# Patient Record
Sex: Male | Born: 1985 | Race: Black or African American | Hispanic: No | Marital: Single | Smoking: Former smoker
Health system: Southern US, Community
[De-identification: ages and names within clinical notes are randomized; demographics above are authoritative.]

## PROBLEM LIST (undated history)

## (undated) DIAGNOSIS — F419 Anxiety disorder, unspecified: Secondary | ICD-10-CM

## (undated) DIAGNOSIS — J45909 Unspecified asthma, uncomplicated: Secondary | ICD-10-CM

## (undated) DIAGNOSIS — F329 Major depressive disorder, single episode, unspecified: Secondary | ICD-10-CM

## (undated) DIAGNOSIS — F32A Depression, unspecified: Secondary | ICD-10-CM

---
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
## 2006-07-10 ENCOUNTER — Emergency Department (HOSPITAL_COMMUNITY): Admission: EM | Admit: 2006-07-10 | Discharge: 2006-07-10 | Payer: Self-pay | Admitting: Emergency Medicine

## 2006-08-31 ENCOUNTER — Emergency Department (HOSPITAL_COMMUNITY): Admission: EM | Admit: 2006-08-31 | Discharge: 2006-08-31 | Payer: Self-pay | Admitting: Emergency Medicine

## 2007-01-30 ENCOUNTER — Emergency Department (HOSPITAL_COMMUNITY): Admission: EM | Admit: 2007-01-30 | Discharge: 2007-01-30 | Payer: Self-pay | Admitting: Emergency Medicine

## 2007-02-16 ENCOUNTER — Emergency Department (HOSPITAL_COMMUNITY): Admission: EM | Admit: 2007-02-16 | Discharge: 2007-02-16 | Payer: Self-pay | Admitting: Emergency Medicine

## 2014-03-31 ENCOUNTER — Emergency Department (HOSPITAL_COMMUNITY): Payer: Self-pay

## 2014-03-31 ENCOUNTER — Encounter (HOSPITAL_COMMUNITY): Payer: Self-pay | Admitting: Emergency Medicine

## 2014-03-31 ENCOUNTER — Emergency Department (HOSPITAL_COMMUNITY)
Admission: EM | Admit: 2014-03-31 | Discharge: 2014-03-31 | Disposition: A | Discharge status: Home / Self Care | Payer: Self-pay | Attending: Emergency Medicine | Admitting: Emergency Medicine

## 2014-03-31 DIAGNOSIS — N453 Epididymo-orchitis: Secondary | ICD-10-CM | POA: Insufficient documentation

## 2014-03-31 DIAGNOSIS — N342 Other urethritis: Secondary | ICD-10-CM | POA: Insufficient documentation

## 2014-03-31 DIAGNOSIS — N451 Epididymitis: Secondary | ICD-10-CM

## 2014-03-31 DIAGNOSIS — F17200 Nicotine dependence, unspecified, uncomplicated: Secondary | ICD-10-CM | POA: Insufficient documentation

## 2014-03-31 HISTORY — DX: Depression, unspecified: F32.A

## 2014-03-31 HISTORY — DX: Major depressive disorder, single episode, unspecified: F32.9

## 2014-03-31 HISTORY — DX: Anxiety disorder, unspecified: F41.9

## 2014-03-31 HISTORY — DX: Unspecified asthma, uncomplicated: J45.909

## 2014-03-31 LAB — URINALYSIS, ROUTINE W REFLEX MICROSCOPIC
Glucose, UA: 250 mg/dL — AB
Hgb urine dipstick: NEGATIVE
Ketones, ur: 15 mg/dL — AB
Nitrite: NEGATIVE
PH: 6 (ref 5.0–8.0)
Protein, ur: 30 mg/dL — AB
Specific Gravity, Urine: 1.025 (ref 1.005–1.030)
UROBILINOGEN UA: 1 mg/dL (ref 0.0–1.0)

## 2014-03-31 LAB — CBC WITH DIFFERENTIAL/PLATELET
Basophils Absolute: 0 10*3/uL (ref 0.0–0.1)
Basophils Relative: 0 % (ref 0–1)
Eosinophils Absolute: 0.1 10*3/uL (ref 0.0–0.7)
Eosinophils Relative: 1 % (ref 0–5)
HCT: 43.7 % (ref 39.0–52.0)
HEMOGLOBIN: 15.9 g/dL (ref 13.0–17.0)
Lymphocytes Relative: 17 % (ref 12–46)
Lymphs Abs: 1.7 10*3/uL (ref 0.7–4.0)
MCH: 32.4 pg (ref 26.0–34.0)
MCHC: 36.4 g/dL — ABNORMAL HIGH (ref 30.0–36.0)
MCV: 89 fL (ref 78.0–100.0)
MONOS PCT: 8 % (ref 3–12)
Monocytes Absolute: 0.8 10*3/uL (ref 0.1–1.0)
Neutro Abs: 7.7 10*3/uL (ref 1.7–7.7)
Neutrophils Relative %: 74 % (ref 43–77)
Platelets: 231 10*3/uL (ref 150–400)
RBC: 4.91 MIL/uL (ref 4.22–5.81)
RDW: 13.3 % (ref 11.5–15.5)
WBC: 10.4 10*3/uL (ref 4.0–10.5)

## 2014-03-31 LAB — COMPREHENSIVE METABOLIC PANEL
ALBUMIN: 4.1 g/dL (ref 3.5–5.2)
ALK PHOS: 116 U/L (ref 39–117)
ALT: 12 U/L (ref 0–53)
AST: 19 U/L (ref 0–37)
BUN: 7 mg/dL (ref 6–23)
CO2: 26 mEq/L (ref 19–32)
CREATININE: 0.91 mg/dL (ref 0.50–1.35)
Calcium: 9.4 mg/dL (ref 8.4–10.5)
Chloride: 102 mEq/L (ref 96–112)
GFR calc Af Amer: >90 mL/min (ref >90)
GFR calc non Af Amer: >90 mL/min (ref >90)
Glucose, Bld: 72 mg/dL (ref 70–99)
POTASSIUM: 3.8 meq/L (ref 3.7–5.3)
Sodium: 141 mEq/L (ref 137–147)
TOTAL PROTEIN: 7.3 g/dL (ref 6.0–8.3)
Total Bilirubin: 0.4 mg/dL (ref 0.3–1.2)

## 2014-03-31 LAB — URINE MICROSCOPIC-ADD ON

## 2014-03-31 MED ORDER — HYDROCODONE-ACETAMINOPHEN 5-325 MG PO TABS: 1.0000 | ORAL_TABLET | Freq: Four times a day (QID) | ORAL | Status: DC | PRN

## 2014-03-31 MED ORDER — IBUPROFEN 800 MG PO TABS: 800.0000 mg | ORAL_TABLET | Freq: Three times a day (TID) | ORAL | Status: DC

## 2014-03-31 MED ORDER — OXYCODONE-ACETAMINOPHEN 5-325 MG PO TABS
1.0000 | ORAL_TABLET | Freq: Once | ORAL | Status: AC
  Administered 2014-03-31: 1 via ORAL
  Filled 2014-03-31: qty 1

## 2014-03-31 MED ORDER — CEFTRIAXONE SODIUM 250 MG IJ SOLR
250.0000 mg | Freq: Once | INTRAMUSCULAR | Status: AC
  Administered 2014-03-31: 250 mg via INTRAMUSCULAR
  Filled 2014-03-31: qty 250

## 2014-03-31 MED ORDER — DOXYCYCLINE HYCLATE 100 MG PO CAPS: 100.0000 mg | ORAL_CAPSULE | Freq: Two times a day (BID) | ORAL | Status: DC

## 2014-03-31 MED ORDER — DOXYCYCLINE HYCLATE 100 MG PO TABS
100.0000 mg | ORAL_TABLET | Freq: Once | ORAL | Status: AC
  Administered 2014-03-31: 100 mg via ORAL
  Filled 2014-03-31: qty 1

## 2014-03-31 NOTE — ED Provider Notes (Signed)
CSN: [id number]     Arrival date & time 03/31/14  1605 History   First MD Initiated Contact with Patient 03/31/14 1702     Chief Complaint  Patient presents with  . Testicle Pain     (Consider location/radiation/quality/duration/timing/severity/associated sxs/prior Treatment) HPI Issam A Edmonston is a 28 y.o. male who presents emergency department complaining of left testicle pain and swelling. States symptoms began yesterday. States he is having pain with voiding. Also admits to penile discharge. Patient 6 reactive, states he does use protection. He denies any prior similar episodes. He denies any abdominal pain or flank pain. He states he felt feverish at home, but is afebrile here. He's not taking any medications for his symptoms. States onset of pain is gradual, started last night, and is worsening today. Pain is worsened with palpation of his testes and urination.  History reviewed. No pertinent past medical history. History reviewed. No pertinent past surgical history. No family history on file. History  Substance Use Topics  . Smoking status: Current Every Day Smoker  . Smokeless tobacco: Not on file  . Alcohol Use: Yes    Review of Systems  Constitutional: Positive for chills. Negative for fever.  Respiratory: Negative for cough, chest tightness and shortness of breath.   Cardiovascular: Negative for chest pain, palpitations and leg swelling.  Gastrointestinal: Negative for nausea, vomiting, abdominal pain, diarrhea and abdominal distention.  Genitourinary: Positive for dysuria, urgency, discharge, scrotal swelling and testicular pain. Negative for frequency, hematuria and penile pain.  Musculoskeletal: Negative for arthralgias, myalgias, neck pain and neck stiffness.  Skin: Negative for rash.  Allergic/Immunologic: Negative for immunocompromised state.  Neurological: Negative for dizziness, weakness, light-headedness, numbness and headaches.      Allergies  Review of  patient's allergies indicates no known allergies.  Home Medications  No current outpatient prescriptions on file. BP 129/84  Pulse 83  Temp(Src) 97.9 F (36.6 C) (Oral)  Resp 14  Ht 5\' 10"  (1.778 m)  Wt 135 lb (61.236 kg)  BMI 19.37 kg/m2  SpO2 100% Physical Exam  Nursing note and vitals reviewed. Constitutional: He is oriented to person, place, and time. He appears well-developed and well-nourished. No distress.  HENT:  Head: Normocephalic and atraumatic.  Eyes: Conjunctivae are normal.  Neck: Neck supple.  Cardiovascular: Normal rate, regular rhythm and normal heart sounds.   Pulmonary/Chest: Effort normal. No respiratory distress. He has no wheezes. He has no rales.  Abdominal: Soft. Bowel sounds are normal. He exhibits no distension. There is no tenderness. There is no rebound.  Genitourinary:  Clear discharge from the penis. No lesions of the perineum. Left testicle is enlarged, tender over the epididymis, cremasteric reflexes present.  Musculoskeletal: He exhibits no edema.  Neurological: He is alert and oriented to person, place, and time.  Skin: Skin is warm and dry.    ED Course  Procedures (including critical care time) Labs Review Labs Reviewed  URINALYSIS, ROUTINE W REFLEX MICROSCOPIC - Abnormal; Notable for the following:    APPearance HAZY (*)    Glucose, UA 250 (*)    Bilirubin Urine SMALL (*)    Ketones, ur 15 (*)    Protein, ur 30 (*)    Leukocytes, UA LARGE (*)    All other components within normal limits  CBC WITH DIFFERENTIAL - Abnormal; Notable for the following:    MCHC 36.4 (*)    All other components within normal limits  URINE MICROSCOPIC-ADD ON - Abnormal; Notable for the following:    Bacteria,  UA MANY (*)    All other components within normal limits  GC/CHLAMYDIA PROBE AMP  URINE CULTURE  COMPREHENSIVE METABOLIC PANEL   Imaging Review [place] Scrotum  03/31/2014   CLINICAL DATA:  Testicular pain .  EXAM: SCROTAL ULTRASOUND  DOPPLER  ULTRASOUND OF THE TESTICLES  TECHNIQUE: Complete ultrasound examination of the testicles, epididymis, and other scrotal structures was performed. Color and spectral Doppler ultrasound were also utilized to evaluate blood flow to the testicles.  COMPARISON:  None.  FINDINGS: Right testicle  Measurements: 4.9 x 2.1 x 3.4 cm. No mass or microlithiasis visualized.  Left testicle  Measurements: 4.4 x 2.3 x 3.2 cm. No mass or microlithiasis visualized.  Right epididymis: 5.9 mm epididymal cyst. Epididymis otherwise unremarkable.  Left epididymis: Mild prominence is noted of the left epididymis with increased blood flow. Epididymitis cannot be excluded.  Hydrocele:  None visualized.  Varicocele:  None visualized.  Pulsed Doppler interrogation of both testes demonstrates low resistance arterial and venous waveforms bilaterally.  IMPRESSION: 1. No evidence of testicular torsion. 2. Cannot exclude mild changes of left epididymitis.   Electronically Signed   By: Maisie Fushomas  Register   On: 03/31/2014 18:53     EKG Interpretation None      MDM   Final diagnoses:  Epididymitis, left  Urethritis   Patient with infected urine, left testicular pain, and tenderness mainly over epididymis. Suspect epididymitis. Will get ultrasound to confirm and to rule out torsion.   7:24 PM US consistent with mild left epididymitis. Given his infected urine and penile discharge will cover for STI. Given rocephin 250mg  IM, will start on doxycycline for 10 days. Fu with urology. Ibuprofen and 10 norcos given for pain  Filed Vitals:   03/31/14 1616 03/31/14 1714  BP: 120/106 129/84  Pulse: 110 83  Temp: 97.8 F (36.6 C) 97.9 F (36.6 C)  TempSrc:  Oral  Resp: 18 14  Height: 5\' 10"  (1.778 m)   Weight: 135 lb (61.236 kg)   SpO2: 98% 100%     Lottie Musselatyana A Jodel Mayhall, PA-C 03/31/14 1930  Lottie Musselatyana A Jayd Forrey, PA-C 03/31/14 1930

## 2014-03-31 NOTE — Discharge Instructions (Signed)
See information below regarding epididymitis. Take ibuprofen for pain. Take doxycycline as prescribed until all gone for the infection. Take Norco for severe pain only. If not improving please followup with urology. No intercourse for a week. Make sure your partner is treated as well for a sexually transmitted infection.   Epididymitis Epididymitis is a swelling (inflammation) of the epididymis. The epididymis is a cord-like structure along the back part of the testicle. Epididymitis is usually, but not always, caused by infection. This is usually a sudden problem beginning with chills, fever and pain behind the scrotum and in the testicle. There may be swelling and redness of the testicle. DIAGNOSIS  Physical examination will reveal a tender, swollen epididymis. Sometimes, cultures are obtained from the urine or from prostate secretions to help find out if there is an infection or if the cause is a different problem. Sometimes, blood work is performed to see if your white blood cell count is elevated and if a germ (bacterial) or viral infection is present. Using this knowledge, an appropriate medicine which kills germs (antibiotic) can be chosen by your caregiver. A viral infection causing epididymitis will most often go away (resolve) without treatment. HOME CARE INSTRUCTIONS   Hot sitz baths for 20 minutes, 4 times per day, may help relieve pain.  Only take over-the-counter or prescription medicines for pain, discomfort or fever as directed by your caregiver.  Take all medicines, including antibiotics, as directed. Take the antibiotics for the full prescribed length of time even if you are feeling better.  It is very important to keep all follow-up appointments. SEEK IMMEDIATE MEDICAL CARE IF:   You have a fever.  You have pain not relieved with medicines.  You have any worsening of your problems.  Your pain seems to come and go.  You develop pain, redness, and swelling in the scrotum  and surrounding areas. MAKE SURE YOU:   Understand these instructions.  Will watch your condition.  Will get help right away if you are not doing well or get worse. Document Released: 12/12/2000 Document Revised: 03/08/2012 Document Reviewed: 11/01/2009 [facility] Patient Information 2014 [employer] [place].

## 2014-03-31 NOTE — ED Notes (Signed)
Introduced self and discussed that results of Ultrasound are not available yet.

## 2014-03-31 NOTE — ED Notes (Signed)
Lemont Fillersatyana, PA-C at the bedside to discuss results of the ultrasound, and the need to continue treatment with medications at home after discharge.   Patient acknowledges.

## 2014-03-31 NOTE — ED Notes (Signed)
The pt is c/o his testicle swelling since yesterday.  He is also having difficulty voiding

## 2014-04-01 LAB — GC/CHLAMYDIA PROBE AMP
CT PROBE, AMP APTIMA: POSITIVE — AB
GC Probe RNA: POSITIVE — AB

## 2014-04-01 NOTE — ED Provider Notes (Signed)
Medical screening examination/treatment/procedure(s) were performed by non-physician practitioner and as supervising physician I was immediately available for consultation/collaboration.   EKG Interpretation None        Junius ArgyleForrest S Skyleigh Windle, MD 04/01/14 1236

## 2014-04-02 ENCOUNTER — Telehealth (HOSPITAL_BASED_OUTPATIENT_CLINIC_OR_DEPARTMENT_OTHER): Payer: Self-pay | Admitting: Emergency Medicine

## 2014-04-02 LAB — URINE CULTURE

## 2014-04-02 NOTE — Telephone Encounter (Signed)
+  Chlamydia. +Gonorrhea. Patient treated with Rocephin and Zithromax. [employer] faxed. 

## 2014-06-10 ENCOUNTER — Other Ambulatory Visit (HOSPITAL_COMMUNITY)
Admission: RE | Admit: 2014-06-10 | Discharge: 2014-06-10 | Disposition: A | Discharge status: Home / Self Care | Payer: Self-pay | Source: Ambulatory Visit | Attending: Emergency Medicine | Admitting: Emergency Medicine

## 2014-06-10 ENCOUNTER — Encounter (HOSPITAL_COMMUNITY): Payer: Self-pay | Admitting: Emergency Medicine

## 2014-06-10 ENCOUNTER — Emergency Department (INDEPENDENT_AMBULATORY_CARE_PROVIDER_SITE_OTHER)
Admission: EM | Admit: 2014-06-10 | Discharge: 2014-06-10 | Disposition: A | Discharge status: Home / Self Care | Payer: Self-pay | Source: Home / Self Care | Attending: Emergency Medicine | Admitting: Emergency Medicine

## 2014-06-10 DIAGNOSIS — N342 Other urethritis: Secondary | ICD-10-CM

## 2014-06-10 DIAGNOSIS — N451 Epididymitis: Secondary | ICD-10-CM

## 2014-06-10 DIAGNOSIS — Z113 Encounter for screening for infections with a predominantly sexual mode of transmission: Secondary | ICD-10-CM | POA: Insufficient documentation

## 2014-06-10 LAB — POCT URINALYSIS DIP (DEVICE)
Bilirubin Urine: NEGATIVE
GLUCOSE, UA: NEGATIVE mg/dL
Hgb urine dipstick: NEGATIVE
KETONES UR: NEGATIVE mg/dL
Leukocytes, UA: NEGATIVE
Nitrite: NEGATIVE
Protein, ur: NEGATIVE mg/dL
SPECIFIC GRAVITY, URINE: 1.02 (ref 1.005–1.030)
Urobilinogen, UA: 0.2 mg/dL (ref 0.0–1.0)
pH: 6 (ref 5.0–8.0)

## 2014-06-10 LAB — HIV ANTIBODY (ROUTINE TESTING W REFLEX): HIV: NONREACTIVE

## 2014-06-10 LAB — RPR

## 2014-06-10 MED ORDER — AZITHROMYCIN 250 MG PO TABS
ORAL_TABLET | ORAL | Status: AC
  Filled 2014-06-10: qty 4

## 2014-06-10 MED ORDER — CEFTRIAXONE SODIUM 250 MG IJ SOLR
250.0000 mg | Freq: Once | INTRAMUSCULAR | Status: AC
  Administered 2014-06-10: 250 mg via INTRAMUSCULAR

## 2014-06-10 MED ORDER — CEFTRIAXONE SODIUM 250 MG IJ SOLR
INTRAMUSCULAR | Status: AC
  Filled 2014-06-10: qty 250

## 2014-06-10 MED ORDER — AZITHROMYCIN 250 MG PO TABS
1000.0000 mg | ORAL_TABLET | Freq: Once | ORAL | Status: AC
  Administered 2014-06-10: 1000 mg via ORAL

## 2014-06-10 NOTE — ED Notes (Signed)
C/o std check up States he does have a discharge and blood coming from penile area

## 2014-06-10 NOTE — ED Provider Notes (Signed)
Chief Complaint   Chief Complaint  Patient presents with  . SEXUALLY TRANSMITTED DISEASE    History of Present Illness   Ryan [name] is a 28 year old male who has had burning with urination, urethral discharge which is clear to yellow to green to bloody. He has some crusted blisters on his penis, and enlarged, tender lymph node in his right groin, and bilateral testicular pain and tenderness with a lump on the left side. He was seen at the emergency room for similar symptoms about 2 months ago. He tested positive for Chlamydia and gonorrhea and was treated with Rocephin and azithromycin. He's been sexually active with a single girlfriend since that time. She's being tested today and. He also was found to have epididymitis. An ultrasound of the scrotum was done which showed no scrotal tumors or masses and no evidence of torsion. He denies fever, chills, eye redness or discharge, sore throat, cervical adenopathy, skin rash, or joint pain.  Review of Systems   Other than as noted above, the patient denies any of the following symptoms: Systemic:  No fevers chills, arthralgias, or adenopathy. GI:  No abdominal pain, nausea or vomiting. GU:  No dysuria, penile pain, discharge, itching, dysuria, genital lesions, testicular pain or swelling. Skin:  No rash or itching.  PMFSH   Past medical history, family history, social history, meds, and allergies were reviewed.   Physical Examination    Vital signs:  BP 112/64  Pulse 81  Temp(Src) 97.9 F (36.6 C) (Oral)  Resp 16  SpO2 100% Gen:  Alert, oriented, in no distress. Abdomen:  Soft and flat, non-distended, and non-tender.  No organomegaly or mass. Genital:  There was no urethral discharge but there was some redness and inflammation around the tip of the urethra. He has some crusted ulcerations on the dorsum of the penis. There is a 1.5 cm tender lymph node in the right groin and the testes are normal. There is a 1 cm lump just below  left testis which was nontender. Skin:  Warm and dry.  No rash.   Labs   Results for orders placed during the hospital encounter of 06/10/14  HIV ANTIBODY (ROUTINE TESTING)      Result Value Ref Range   HIV 1&2 Ab, 4th Generation NONREACTIVE  NONREACTIVE  RPR      Result Value Ref Range   RPR NON REAC  NON REAC  POCT URINALYSIS DIP (DEVICE)      Result Value Ref Range   Glucose, UA NEGATIVE  NEGATIVE mg/dL   Bilirubin Urine NEGATIVE  NEGATIVE   Ketones, ur NEGATIVE  NEGATIVE mg/dL   Specific Gravity, Urine 1.020  1.005 - 1.030   Hgb urine dipstick NEGATIVE  NEGATIVE   pH 6.0  5.0 - 8.0   Protein, ur NEGATIVE  NEGATIVE mg/dL   Urobilinogen, UA 0.2  0.0 - 1.0 mg/dL   Nitrite NEGATIVE  NEGATIVE   Leukocytes, UA NEGATIVE  NEGATIVE     DNA probes for gonorrhea, Chlamydia, Trichomonas were also obtained.  Course in Urgent Care Center   Given Rocephin 250 mg IM and azithromycin 1000 mg by mouth.   Assessment   The primary encounter diagnosis was Urethritis. A diagnosis of Epididymitis was also pertinent to this visit.  Plan    1.  Meds:  The following meds were prescribed:   Discharge Medication List as of 06/10/2014 12:11 PM      2.  Patient Education/Counseling:  The patient was given appropriate handouts,  self care instructions, and instructed in symptomatic relief.The patient was instructed to inform all sexual contacts, avoid intercourse completely for 2 weeks and then only with a condom.  The patient was told that we would call about all abnormal lab results, and that we would need to report certain kinds of infection to the health department.    3.  Follow up:  The patient was told to follow up here if no better in 3 to 4 days, or sooner if becoming worse in any way, and given some red flag symptoms such as fever, pain, or difficulty urinating which would prompt immediate return.  Follow up with Dr. Vernie Ammonsttelin with regard to the epididymitis. He's had ultrasound of the mass  in his left scrotum, so very unlikely that this is due to a tumor.     Reuben Likesavid C Savian Mazon, MD 06/10/14 2136

## 2014-06-10 NOTE — Discharge Instructions (Signed)
Follow up with Dr. Vernie Ammonsttelin, the lump in the testicle could be a tumor.    No intercourse for next week and then only with a condom.

## 2014-06-10 NOTE — ED Notes (Signed)
Call back number verified.  

## 2014-06-12 LAB — HSV 2 ANTIBODY, IGG

## 2014-06-13 NOTE — ED Notes (Signed)
GC/Chlamydia pos., HIV/RPR non-reactive, HSV 2 <0.10 neg.  Pt. adequately treated with Rocephin and Zithromax.  Pt. needs notified. Vassie MoselleYork, Suzanne M 06/13/2014

## 2014-06-14 ENCOUNTER — Telehealth (HOSPITAL_COMMUNITY): Payer: Self-pay | Admitting: *Deleted

## 2014-06-14 NOTE — ED Notes (Signed)
I called and left a message to call.  Call 1.  [facility] x 2 [facility]. Vassie MoselleYork, Suzanne M 06/14/2014

## 2014-06-15 NOTE — ED Notes (Signed)
Pt. called back.  Pt. verified x 2 and given results. Pt. told he was adequately treated while he was here.  Pt. instructed to notify his partner, no sex for 1 week after the medication and to practice safe sex. Pt. told he [facility], by appointment.  Pt.'s questions about Herpes and Trich answered.  Pt. voiced understanding. Vassie MoselleYork, Suzanne M 06/15/2014

## 2016-05-14 ENCOUNTER — Encounter (HOSPITAL_COMMUNITY): Payer: Self-pay

## 2016-05-14 ENCOUNTER — Emergency Department (HOSPITAL_COMMUNITY)
Admission: EM | Admit: 2016-05-14 | Discharge: 2016-05-15 | Disposition: A | Discharge status: Home / Self Care | Payer: Self-pay | Attending: Emergency Medicine | Admitting: Emergency Medicine

## 2016-05-14 DIAGNOSIS — R109 Unspecified abdominal pain: Secondary | ICD-10-CM

## 2016-05-14 DIAGNOSIS — F17210 Nicotine dependence, cigarettes, uncomplicated: Secondary | ICD-10-CM | POA: Insufficient documentation

## 2016-05-14 DIAGNOSIS — Z202 Contact with and (suspected) exposure to infections with a predominantly sexual mode of transmission: Secondary | ICD-10-CM | POA: Insufficient documentation

## 2016-05-14 DIAGNOSIS — N5089 Other specified disorders of the male genital organs: Secondary | ICD-10-CM

## 2016-05-14 DIAGNOSIS — N509 Disorder of male genital organs, unspecified: Secondary | ICD-10-CM | POA: Insufficient documentation

## 2016-05-14 DIAGNOSIS — J45909 Unspecified asthma, uncomplicated: Secondary | ICD-10-CM | POA: Insufficient documentation

## 2016-05-14 DIAGNOSIS — Z791 Long term (current) use of non-steroidal anti-inflammatories (NSAID): Secondary | ICD-10-CM | POA: Insufficient documentation

## 2016-05-14 DIAGNOSIS — Z792 Long term (current) use of antibiotics: Secondary | ICD-10-CM | POA: Insufficient documentation

## 2016-05-14 DIAGNOSIS — Z8659 Personal history of other mental and behavioral disorders: Secondary | ICD-10-CM | POA: Insufficient documentation

## 2016-05-14 LAB — COMPREHENSIVE METABOLIC PANEL
ALT: 20 U/L (ref 17–63)
AST: 21 U/L (ref 15–41)
Albumin: 4.4 g/dL (ref 3.5–5.0)
Alkaline Phosphatase: 69 U/L (ref 38–126)
Anion gap: 10 (ref 5–15)
BUN: 6 mg/dL (ref 6–20)
CO2: 28 mmol/L (ref 22–32)
Calcium: 9.7 mg/dL (ref 8.9–10.3)
Chloride: 104 mmol/L (ref 101–111)
Creatinine, Ser: 1.21 mg/dL (ref 0.61–1.24)
GFR calc Af Amer: >60 mL/min (ref >60)
GFR calc non Af Amer: >60 mL/min (ref >60)
Glucose, Bld: 87 mg/dL (ref 65–99)
Potassium: 3.8 mmol/L (ref 3.5–5.1)
Sodium: 142 mmol/L (ref 135–145)
Total Bilirubin: 0.9 mg/dL (ref 0.3–1.2)
Total Protein: 7.2 g/dL (ref 6.5–8.1)

## 2016-05-14 LAB — URINE MICROSCOPIC-ADD ON: RBC / HPF: NONE SEEN RBC/hpf (ref 0–5)

## 2016-05-14 LAB — URINALYSIS, ROUTINE W REFLEX MICROSCOPIC
Bilirubin Urine: NEGATIVE
Glucose, UA: NEGATIVE mg/dL
Hgb urine dipstick: NEGATIVE
Ketones, ur: NEGATIVE mg/dL
Nitrite: NEGATIVE
Protein, ur: NEGATIVE mg/dL
Specific Gravity, Urine: 1.016 (ref 1.005–1.030)
pH: 6 (ref 5.0–8.0)

## 2016-05-14 LAB — CBC
HCT: 41.9 % (ref 39.0–52.0)
Hemoglobin: 14.8 g/dL (ref 13.0–17.0)
MCH: 31.8 pg (ref 26.0–34.0)
MCHC: 35.3 g/dL (ref 30.0–36.0)
MCV: 90.1 fL (ref 78.0–100.0)
Platelets: 328 10*3/uL (ref 150–400)
RBC: 4.65 MIL/uL (ref 4.22–5.81)
RDW: 12.9 % (ref 11.5–15.5)
WBC: 7.9 10*3/uL (ref 4.0–10.5)

## 2016-05-14 LAB — LIPASE, BLOOD: Lipase: 22 U/L (ref 11–51)

## 2016-05-14 MED ORDER — METRONIDAZOLE 500 MG PO TABS
2000.0000 mg | ORAL_TABLET | Freq: Once | ORAL | Status: AC
  Administered 2016-05-15: 2000 mg via ORAL
  Filled 2016-05-14: qty 4

## 2016-05-14 MED ORDER — ONDANSETRON 4 MG PO TBDP
4.0000 mg | ORAL_TABLET | Freq: Once | ORAL | Status: AC
  Administered 2016-05-15: 4 mg via ORAL
  Filled 2016-05-14: qty 1

## 2016-05-14 MED ORDER — STERILE WATER FOR INJECTION IJ SOLN
INTRAMUSCULAR | Status: AC
  Administered 2016-05-15: 2 mL
  Filled 2016-05-14: qty 10

## 2016-05-14 MED ORDER — AZITHROMYCIN 250 MG PO TABS
1000.0000 mg | ORAL_TABLET | Freq: Once | ORAL | Status: AC
Start: 2016-05-15 — End: 2016-05-15
  Administered 2016-05-15: 1000 mg via ORAL
  Filled 2016-05-14: qty 4

## 2016-05-14 MED ORDER — CEFTRIAXONE SODIUM 250 MG IJ SOLR
250.0000 mg | Freq: Once | INTRAMUSCULAR | Status: AC
  Administered 2016-05-15: 250 mg via INTRAMUSCULAR
  Filled 2016-05-14: qty 250

## 2016-05-14 NOTE — ED Notes (Signed)
Pt reports LLQ abdominal pain and occasional dysuria, unsure when it started. He denies any other symptoms.

## 2016-05-14 NOTE — ED Provider Notes (Signed)
CSN: [id number]     Arrival date & time 05/14/16  2035 History   First MD Initiated Contact with Patient 05/14/16 2256     Chief Complaint  Patient presents with  . Abdominal Pain     (Consider location/radiation/quality/duration/timing/severity/associated sxs/prior Treatment) HPI  This is a 30 year old male who presents with multiple complaints. Patient states that he had some intermittent pain in the left side of his abdomen. He states it comes and goes. He has had associated dysuria over the past 2 weeks. He denies any constipation, diarrhea. Has no active pain, nausea, vomiting, fevers, chills, myalgias. Patient is also concerned because he has some "swelling in his neck which comes and goes." He is also concerned because of a knot on his left testicle. He states that his girlfriend was here 2 weeks ago, diagnosed with PID. He has not been tested or treated. He denies any discharge from his penis. Past Medical History  Diagnosis Date  . Asthma   . Anxiety   . Depression    History reviewed. No pertinent past surgical history. No family history on file. Social History  Substance Use Topics  . Smoking status: Current Every Day Smoker -- 1.00 packs/day for 5 years    Types: Cigarettes  . Smokeless tobacco: None  . Alcohol Use: 1.2 oz/week    2 Cans of beer per week     Comment: had a beer last night.     Review of Systems   Ten systems reviewed and are negative for acute change, except as noted in the HPI.   Allergies  Strawberry extract and Pollen extract  Home Medications   Prior to Admission medications   Medication Sig Start Date End Date Taking? Authorizing Provider  ibuprofen (ADVIL,MOTRIN) 200 MG tablet Take 200 mg by mouth every 6 (six) hours as needed for headache or moderate pain.   Yes Historical Provider, MD  doxycycline (VIBRAMYCIN) 100 MG capsule Take 1 capsule (100 mg total) by mouth 2 (two) times daily. 03/31/14   Tatyana Kirichenko, PA-C   HYDROcodone-acetaminophen (NORCO) 5-325 MG per tablet Take 1 tablet by mouth every 6 (six) hours as needed for moderate pain. 03/31/14   Tatyana Kirichenko, PA-C  ibuprofen (ADVIL,MOTRIN) 800 MG tablet Take 1 tablet (800 mg total) by mouth 3 (three) times daily. 03/31/14   Tatyana Kirichenko, PA-C   BP 108/88 mmHg  Pulse 70  Temp(Src) 98.1 F (36.7 C) (Oral)  Resp 16  SpO2 98% Physical Exam  Constitutional: He appears well-developed and well-nourished. No distress.  HENT:  Head: Normocephalic and atraumatic.  Eyes: Conjunctivae are normal. No scleral icterus.  Neck: Normal range of motion. Neck supple.  Cardiovascular: Normal rate, regular rhythm and normal heart sounds.   Pulmonary/Chest: Effort normal and breath sounds normal. No respiratory distress.  Abdominal: Soft. He exhibits no distension and no mass. There is no tenderness. There is no guarding.  Genitourinary:  Normal male anatomy. Circumcised. No discharge or lesions. There is a firm, irregular mass attached firmly to the left testicle. No pain with palpation, normal cremasteric reflex.  Musculoskeletal: He exhibits no edema.  Neurological: He is alert.  Skin: Skin is warm and dry. He is not diaphoretic.  Psychiatric: His behavior is normal.  Nursing note and vitals reviewed.   ED Course  Procedures (including critical care time) Labs Review Labs Reviewed  URINALYSIS, ROUTINE W REFLEX MICROSCOPIC (NOT AT [facility]) - Abnormal; Notable for the following:    Leukocytes, UA SMALL (*)  All other components within normal limits  URINE MICROSCOPIC-ADD ON - Abnormal; Notable for the following:    Squamous Epithelial / LPF 0-5 (*)    Bacteria, UA RARE (*)    All other components within normal limits  LIPASE, BLOOD  COMPREHENSIVE METABOLIC PANEL  CBC    Imaging Review No results found. I have personally reviewed and evaluated these images and lab results as part of my medical decision-making.   EKG Interpretation None       MDM   Final diagnoses:  Exposure to STD  Testicular mass  Abdominal discomfort    Patient has to leave to pick up his son. We will treat prophylactically for infection. Patient is advised to follow-up for evaluation of the testicle as well as testing for HIV and syphilis. He has no abdominal pain at this time. Benign abdominal exam. Appears Safe for discharge. Discussed return precautions    Arthor Captainbigail Shaasia Odle, PA-C 05/15/16 0151  Raeford RazorStephen Kohut, MD 05/22/16 [phone]

## 2016-05-15 NOTE — Discharge Instructions (Signed)
Please return for a testicular UltraSound    Abdominal Pain, Adult Many things can cause abdominal pain. Usually, abdominal pain is not caused by a disease and will improve without treatment. It can often be observed and treated at home. Your health care provider will do a physical exam and possibly order blood tests and X-rays to help determine the seriousness of your pain. However, in many cases, more time must pass before a clear cause of the pain can be found. Before that point, your health care provider may not know if you need more testing or further treatment. HOME CARE INSTRUCTIONS Monitor your abdominal pain for any changes. The following actions may help to alleviate any discomfort you are experiencing:  Only take over-the-counter or prescription medicines as directed by your health care provider.  Do not take laxatives unless directed to do so by your health care provider.  Try a clear liquid diet (broth, tea, or water) as directed by your health care provider. Slowly move to a bland diet as tolerated. SEEK MEDICAL CARE IF:  You have unexplained abdominal pain.  You have abdominal pain associated with nausea or diarrhea.  You have pain when you urinate or have a bowel movement.  You experience abdominal pain that wakes you in the night.  You have abdominal pain that is worsened or improved by eating food.  You have abdominal pain that is worsened with eating fatty foods.  You have a fever. SEEK IMMEDIATE MEDICAL CARE IF:  Your pain does not go away within 2 hours.  You keep throwing up (vomiting).  Your pain is felt only in portions of the abdomen, such as the right side or the left lower portion of the abdomen.  You pass bloody or black tarry stools. MAKE SURE YOU:  Understand these instructions.  Will watch your condition.  Will get help right away if you are not doing well or get worse.   This information is not intended to replace advice given to you by  your health care provider. Make sure you discuss any questions you have with your health care provider.   Document Released: 09/24/2005 Document Revised: 09/05/2015 Document Reviewed: 08/24/2013 Elsevier Interactive Patient Education [employer]

## 2016-10-24 ENCOUNTER — Emergency Department (HOSPITAL_COMMUNITY)
Admission: EM | Admit: 2016-10-24 | Discharge: 2016-10-26 | Disposition: A | Discharge status: Home / Self Care | Payer: Self-pay | Attending: Dermatology | Admitting: Dermatology

## 2016-10-24 ENCOUNTER — Encounter (HOSPITAL_COMMUNITY): Payer: Self-pay | Admitting: Nurse Practitioner

## 2016-10-24 DIAGNOSIS — J45909 Unspecified asthma, uncomplicated: Secondary | ICD-10-CM | POA: Insufficient documentation

## 2016-10-24 DIAGNOSIS — H9201 Otalgia, right ear: Secondary | ICD-10-CM | POA: Insufficient documentation

## 2016-10-24 DIAGNOSIS — F17210 Nicotine dependence, cigarettes, uncomplicated: Secondary | ICD-10-CM | POA: Insufficient documentation

## 2016-10-24 NOTE — ED Notes (Addendum)
Pt c/o swelling and tenderness to R ear and neck, swollen area noted.  Pt also c/o intermittent dizziness that clears quickly for unkn amount of time.  Denies fever, denies n/v/d, denies pain at this time, only tenderness on palpation  Pt can be hear arguing with male on the telephone.

## 2016-10-24 NOTE — ED Triage Notes (Signed)
Pt presents with chief complaint of R ear pain. He reports onset of pain several days ago. The pain has been constant since onset. He has noticed a swolen area behind his R ear that he feels is expanding into his throat and making him feel like he can not breathe normally. He reports intermittent dizziness. He denies fevers, rhinorrhea, cough. He is alert and breathing easily

## 2016-10-24 NOTE — ED Notes (Signed)
Pt d/c himself from monitors and walked from department. Pt talking on telephone while walking out. Pt apologized after when ask if he was leaving and kept walking to waiting area

## 2016-11-05 ENCOUNTER — Encounter (HOSPITAL_COMMUNITY): Payer: Self-pay | Admitting: Emergency Medicine

## 2016-11-05 ENCOUNTER — Emergency Department (HOSPITAL_COMMUNITY)
Admission: EM | Admit: 2016-11-05 | Discharge: 2016-11-05 | Disposition: A | Discharge status: Home / Self Care | Payer: Self-pay | Attending: Emergency Medicine | Admitting: Emergency Medicine

## 2016-11-05 ENCOUNTER — Emergency Department (HOSPITAL_COMMUNITY): Payer: Self-pay

## 2016-11-05 DIAGNOSIS — R0789 Other chest pain: Secondary | ICD-10-CM | POA: Insufficient documentation

## 2016-11-05 DIAGNOSIS — J45909 Unspecified asthma, uncomplicated: Secondary | ICD-10-CM | POA: Insufficient documentation

## 2016-11-05 DIAGNOSIS — R079 Chest pain, unspecified: Secondary | ICD-10-CM

## 2016-11-05 DIAGNOSIS — F17210 Nicotine dependence, cigarettes, uncomplicated: Secondary | ICD-10-CM | POA: Insufficient documentation

## 2016-11-05 DIAGNOSIS — R42 Dizziness and giddiness: Secondary | ICD-10-CM | POA: Insufficient documentation

## 2016-11-05 LAB — BASIC METABOLIC PANEL
Anion gap: 8 (ref 5–15)
BUN: 8 mg/dL (ref 6–20)
CALCIUM: 9.4 mg/dL (ref 8.9–10.3)
CHLORIDE: 107 mmol/L (ref 101–111)
CO2: 26 mmol/L (ref 22–32)
CREATININE: 1.08 mg/dL (ref 0.61–1.24)
GFR calc Af Amer: >60 mL/min (ref >60)
GFR calc non Af Amer: >60 mL/min (ref >60)
GLUCOSE: 98 mg/dL (ref 65–99)
Potassium: 3.9 mmol/L (ref 3.5–5.1)
Sodium: 141 mmol/L (ref 135–145)

## 2016-11-05 LAB — CBC
HEMATOCRIT: 41.3 % (ref 39.0–52.0)
HEMOGLOBIN: 14.5 g/dL (ref 13.0–17.0)
MCH: 31.3 pg (ref 26.0–34.0)
MCHC: 35.1 g/dL (ref 30.0–36.0)
MCV: 89 fL (ref 78.0–100.0)
Platelets: 220 10*3/uL (ref 150–400)
RBC: 4.64 MIL/uL (ref 4.22–5.81)
RDW: 13.3 % (ref 11.5–15.5)
WBC: 6.6 10*3/uL (ref 4.0–10.5)

## 2016-11-05 LAB — I-STAT TROPONIN, ED: Troponin i, poc: 0 ng/mL (ref 0.00–0.08)

## 2016-11-05 MED ORDER — FAMOTIDINE 20 MG PO TABS: 20.0000 mg | ORAL_TABLET | Freq: Two times a day (BID) | ORAL | 0 refills | Status: DC

## 2016-11-05 MED ORDER — NAPROXEN 500 MG PO TABS: 500.0000 mg | ORAL_TABLET | Freq: Two times a day (BID) | ORAL | 0 refills | Status: DC

## 2016-11-05 MED ORDER — GI COCKTAIL ~~LOC~~
30.0000 mL | Freq: Once | ORAL | Status: AC
  Administered 2016-11-05: 30 mL via ORAL
  Filled 2016-11-05: qty 30

## 2016-11-05 NOTE — Discharge Instructions (Signed)
Please read and follow all provided instructions.  Your diagnoses today include:  1. Chest pain, unspecified type   2. Dizziness     Tests performed today include:  An EKG of your heart  A chest x-ray  Cardiac enzymes - a blood test for heart muscle damage  Blood counts and electrolytes  Vital signs. See below for your results today.   Medications prescribed:   Naproxen - anti-inflammatory pain medication  Do not exceed 500mg  naproxen every 12 hours, take with food  You have been prescribed an anti-inflammatory medication or NSAID. Take with food. Take smallest effective dose for the shortest duration needed for your pain. Stop taking if you experience stomach pain or vomiting.    Pepcid (famotidine) - antihistamine  You can find this medication over-the-counter.   DO NOT exceed:   20mg  Pepcid every 12 hours  Take any prescribed medications only as directed.  Follow-up instructions: Please follow-up with your primary care provider as soon as you can for further evaluation of your symptoms.   Return instructions:  SEEK IMMEDIATE MEDICAL ATTENTION IF:  You have severe chest pain, especially if the pain is crushing or pressure-like and spreads to the arms, back, neck, or jaw, or if you have sweating, nausea (feeling sick to your stomach), or shortness of breath. THIS IS AN EMERGENCY. Don't wait to see if the pain will go away. Get medical help at once. Call 911 or 0 (operator). DO NOT drive yourself to the hospital.   Your chest pain gets worse and does not go away with rest.   You have an attack of chest pain lasting longer than usual, despite rest and treatment with the medications your caregiver has prescribed.   You wake from sleep with chest pain or shortness of breath.  You feel dizzy or faint.  You have chest pain not typical of your usual pain for which you originally saw your caregiver.   You have any other emergent concerns regarding your  health.  Additional Information: Chest pain comes from many different causes. Your caregiver has diagnosed you as having chest pain that is not specific for one problem, but does not require admission.  You are at low risk for an acute heart condition or other serious illness.   Your vital signs today were: BP 114/65    Pulse 72    Temp 97.6 F (36.4 C) (Oral)    Resp 23    SpO2 98%  If your blood pressure (BP) was elevated above 135/85 this visit, please have this repeated by your doctor within one month. --------------

## 2016-11-05 NOTE — ED Triage Notes (Signed)
Pt sts CP x 1 week and dizziness x 2 weeks

## 2016-11-05 NOTE — ED Provider Notes (Signed)
[facility]Y DEPT Provider Note   CSN: [id number] Arrival date & time: 11/05/16  0813     History   Chief Complaint Chief Complaint  Patient presents with  . Chest Pain  . Dizziness    HPI Ryan [name] is a 30 y.o. male.  Patient witha past medical history presents with complaint of dizziness and chest pain. Dizziness has been ongoing for approximately 2 weeks. He describes this as a sensation of being off balance when he walks. It occurs every day. Patient denies other signs of stroke including: facial droop, slurred speech, aphasia, weakness/numbness in extremities. He has not fallen. Patient also complains of chest pain which he describes as a pressure in his left chest. Has occurred for at least one week and has been constant for 'at least a full day'. This radiates to his face at times. It is not associated with activity or food. Chest pain is not positional in nature. Patient denies diaphoresis, vomiting. Patient states he has palpitations sometimes when he is startled. Patient denies history of hypertension, hypercholesterolemia, diabetes, family history of CAD, suspect especially young age. Denies sudden cardiac death in family. Patient does admit to smoking cigarettes although he is trying to cut back. He also smokes marijuana and drinks alcohol occasionally. He denies IV drug use or cocaine use.       Past Medical History:  Diagnosis Date  . Anxiety   . Asthma   . Depression     There are no active problems to display for this patient.   History reviewed. No pertinent surgical history.     Home Medications    Prior to Admission medications   Medication Sig Start Date End Date Taking? Authorizing Provider  doxycycline (VIBRAMYCIN) 100 MG capsule Take 1 capsule (100 mg total) by mouth 2 (two) times daily. Patient not taking: Reported on 10/24/2016 03/31/14   Jaynie Crumble, PA-C  HYDROcodone-acetaminophen (NORCO) 5-325 MG per tablet Take 1 tablet by mouth  every 6 (six) hours as needed for moderate pain. Patient not taking: Reported on 10/24/2016 03/31/14   Tatyana Kirichenko, PA-C  ibuprofen (ADVIL,MOTRIN) 800 MG tablet Take 1 tablet (800 mg total) by mouth 3 (three) times daily. Patient not taking: Reported on 10/24/2016 03/31/14   Jaynie Crumble, PA-C    Family History History reviewed. No pertinent family history.  Social History Social History  Substance Use Topics  . Smoking status: Current Every Day Smoker    Packs/day: 1.00    Years: 5.00    Types: Cigarettes  . Smokeless tobacco: Never Used  . Alcohol use 1.2 oz/week    2 Cans of beer per week     Comment: had a beer last night.      Allergies   Strawberry extract and Pollen extract   Review of Systems Review of Systems  Constitutional: Negative for diaphoresis and fever.  Eyes: Negative for redness.  Respiratory: Negative for cough and shortness of breath.   Cardiovascular: Positive for chest pain. Negative for palpitations and leg swelling.  Gastrointestinal: Negative for abdominal pain, nausea and vomiting.  Genitourinary: Negative for dysuria.  Musculoskeletal: Negative for back pain, gait problem and neck pain.  Skin: Negative for rash.  Neurological: Positive for dizziness. Negative for syncope and light-headedness.  Psychiatric/Behavioral: The patient is nervous/anxious.      Physical Exam Updated Vital Signs BP 125/74   Pulse 86   Temp 97.6 F (36.4 C) (Oral)   Resp 19   SpO2 100%   Physical Exam  Constitutional: He is oriented to person, place, and time. He appears well-developed and well-nourished.  HENT:  Head: Normocephalic and atraumatic.  Right Ear: Tympanic membrane, external ear and ear canal normal.  Left Ear: Tympanic membrane, external ear and ear canal normal.  Nose: Nose normal.  Mouth/Throat: Uvula is midline, oropharynx is clear and moist and mucous membranes are normal. Mucous membranes are not dry.  Eyes: Conjunctivae, EOM and  lids are normal. Pupils are equal, round, and reactive to light.  Neck: Trachea normal and normal range of motion. Neck supple. Normal carotid pulses and no JVD present. No muscular tenderness present. Carotid bruit is not present. No tracheal deviation present.  Cardiovascular: Normal rate, regular rhythm, S1 normal, S2 normal, normal heart sounds and intact distal pulses.  Exam reveals no distant heart sounds and no decreased pulses.   No murmur heard. Pulmonary/Chest: Effort normal and breath sounds normal. No respiratory distress. He has no wheezes. He exhibits no tenderness.  Abdominal: Soft. Normal aorta and bowel sounds are normal. There is no tenderness. There is no rebound and no guarding.  Musculoskeletal: Normal range of motion. He exhibits no edema.       Cervical back: He exhibits normal range of motion, no tenderness and no bony tenderness.  Neurological: He is alert and oriented to person, place, and time. He has normal strength and normal reflexes. No cranial nerve deficit or sensory deficit. He exhibits normal muscle tone. He displays a negative Romberg sign. Coordination and gait normal. GCS eye subscore is 4. GCS verbal subscore is 5. GCS motor subscore is 6.  Skin: Skin is warm and dry. He is not diaphoretic. No cyanosis. No pallor.  Psychiatric: His speech is normal and behavior is normal. Thought content normal. His mood appears anxious. Impaired:   Nursing note and vitals reviewed.    ED Treatments / Results  Labs (all labs ordered are listed, but only abnormal results are displayed) Labs Reviewed - No data to display  EKG  EKG Interpretation  Date/Time:  Wednesday November 05 2016 08:18:19 EST Ventricular Rate:  92 PR Interval:    QRS Duration: 87 QT Interval:  349 QTC Calculation: 432 R Axis:   88 Text Interpretation:  Sinus rhythm Borderline short PR interval early repolarization. no STEMI. Confirmed by Donnald GarrePfeiffer, MD, Lebron ConnersMarcy [phone]([id number]) on 11/05/2016 8:31:26 AM         Radiology Dg Chest 2 View  Result Date: 11/05/2016 CLINICAL DATA:  Left chest pain and chest tightness EXAM: CHEST  2 VIEW COMPARISON:  02/16/2007 FINDINGS: Normal heart size. Lungs are hyperaerated and clear. No pneumothorax. No pleural effusion. IMPRESSION: No active cardiopulmonary disease. Electronically Signed   By: Jolaine ClickArthur  Hoss M.D.   On: 11/05/2016 09:22    Procedures Procedures (including critical care time)  Medications Ordered in ED Medications  gi cocktail (Maalox,Lidocaine,Donnatal) (30 mLs Oral Given 11/05/16 0849)     Initial Impression / Assessment and Plan / ED Course  I have reviewed the triage vital signs and the nursing notes.  Pertinent labs & imaging results that were available during my care of the patient were reviewed by me and considered in my medical decision making (see chart for details).  Clinical Course     Patient seen and examined. EKG reviewed with Dr. Donnald GarrePfeiffer. Work-up initiated. Medications ordered.   Vital signs reviewed and are as follows: BP 125/74   Pulse 86   Temp 97.6 F (36.4 C) (Oral)   Resp 19   SpO2 100%  10:27 AM patient did have some relief with GI cocktail. He is otherwise stable. Informed of all results. Tried to reassure the patient. Discussed trial of naproxen and Pepcid. Will give PCP referral.  Patient was counseled to return with severe chest pain, especially if the pain is crushing or pressure-like and spreads to the arms, back, neck, or jaw, or if they have sweating, nausea, or shortness of breath with the pain. They were encouraged to call 911 with these symptoms.   They were also told to return if their chest pain gets worse and does not go away with rest, they have an attack of chest pain lasting longer than usual despite rest and treatment with the medications their caregiver has prescribed, if they wake from sleep with chest pain or shortness of breath, if they feel dizzy or faint, if they have chest pain not  typical of their usual pain, or if they have any other emergent concerns regarding their health.  atient counseled to return if they have weakness in their arms or legs, slurred speech, trouble walking or talking, confusion, trouble with their balance, or if they have any other concerns. Patient verbalizes understanding and agrees with plan.   The patient verbalized understanding and agreed.    Final Clinical Impressions(s) / ED Diagnoses   Final diagnoses:  Chest pain, unspecified type  Dizziness   Patient with nonspecific chest pain. Heart score = 0. Feel patient is low risk for ACS given history (poor story for ACS/MI), negative troponin(s), normal/unchanged EKG. no concerning signs or risk factors for pulmonary embolism. Do not feel that delta troponin is necessary given that his current pain is been ongoing for more than 24 hours.   New Prescriptions New Prescriptions   FAMOTIDINE (PEPCID) 20 MG TABLET    Take 1 tablet (20 mg total) by mouth 2 (two) times daily.   NAPROXEN (NAPROSYN) 500 MG TABLET    Take 1 tablet (500 mg total) by mouth 2 (two) times daily.     Renne CriglerJoshua Oron Westrup, PA-C 11/05/16 1029    Arby BarretteMarcy Pfeiffer, MD 11/06/16 [phone]

## 2016-11-05 NOTE — ED Notes (Signed)
Pt admits to having anxiety in the past and states he has had a lot of stress lately. Pt states when he feels anxious, he starts feeling dizzy/having chest pain.

## 2016-11-05 NOTE — ED Notes (Signed)
Myself and Kaitlyn, EMT brought patient back to room via wheelchair; patient undressed, in gown, on monitor, continuous pulse oximetry and blood pressure cuff; vitals and EKG performed; warm blankets given

## 2017-02-08 ENCOUNTER — Emergency Department (HOSPITAL_COMMUNITY)
Admission: EM | Admit: 2017-02-08 | Discharge: 2017-02-08 | Disposition: A | Discharge status: Home / Self Care | Payer: Self-pay | Attending: Emergency Medicine | Admitting: Emergency Medicine

## 2017-02-08 ENCOUNTER — Emergency Department (HOSPITAL_COMMUNITY): Payer: Self-pay

## 2017-02-08 ENCOUNTER — Encounter (HOSPITAL_COMMUNITY): Payer: Self-pay | Admitting: Emergency Medicine

## 2017-02-08 DIAGNOSIS — R079 Chest pain, unspecified: Secondary | ICD-10-CM | POA: Insufficient documentation

## 2017-02-08 DIAGNOSIS — R42 Dizziness and giddiness: Secondary | ICD-10-CM | POA: Insufficient documentation

## 2017-02-08 DIAGNOSIS — F17210 Nicotine dependence, cigarettes, uncomplicated: Secondary | ICD-10-CM | POA: Insufficient documentation

## 2017-02-08 DIAGNOSIS — J45909 Unspecified asthma, uncomplicated: Secondary | ICD-10-CM | POA: Insufficient documentation

## 2017-02-08 DIAGNOSIS — Z79899 Other long term (current) drug therapy: Secondary | ICD-10-CM | POA: Insufficient documentation

## 2017-02-08 LAB — BASIC METABOLIC PANEL
Anion gap: 9 (ref 5–15)
BUN: 9 mg/dL (ref 6–20)
CHLORIDE: 102 mmol/L (ref 101–111)
CO2: 25 mmol/L (ref 22–32)
CREATININE: 1.13 mg/dL (ref 0.61–1.24)
Calcium: 9.4 mg/dL (ref 8.9–10.3)
GFR calc Af Amer: >60 mL/min (ref >60)
Glucose, Bld: 99 mg/dL (ref 65–99)
Potassium: 4.3 mmol/L (ref 3.5–5.1)
SODIUM: 136 mmol/L (ref 135–145)

## 2017-02-08 LAB — CBC
HCT: 42 % (ref 39.0–52.0)
Hemoglobin: 14.9 g/dL (ref 13.0–17.0)
MCH: 31.6 pg (ref 26.0–34.0)
MCHC: 35.5 g/dL (ref 30.0–36.0)
MCV: 89 fL (ref 78.0–100.0)
PLATELETS: 217 10*3/uL (ref 150–400)
RBC: 4.72 MIL/uL (ref 4.22–5.81)
RDW: 12.5 % (ref 11.5–15.5)
WBC: 6.1 10*3/uL (ref 4.0–10.5)

## 2017-02-08 LAB — I-STAT TROPONIN, ED: Troponin i, poc: 0 ng/mL (ref 0.00–0.08)

## 2017-02-08 LAB — CBG MONITORING, ED: Glucose-Capillary: 100 mg/dL — ABNORMAL HIGH (ref 65–99)

## 2017-02-08 MED ORDER — MECLIZINE HCL 25 MG PO TABS
25.0000 mg | ORAL_TABLET | Freq: Once | ORAL | Status: AC
  Administered 2017-02-08: 25 mg via ORAL
  Filled 2017-02-08: qty 1

## 2017-02-08 MED ORDER — MECLIZINE HCL 25 MG PO TABS: 25.0000 mg | ORAL_TABLET | Freq: Three times a day (TID) | ORAL | 0 refills | Status: DC | PRN

## 2017-02-08 NOTE — ED Notes (Signed)
Patient going to CT

## 2017-02-08 NOTE — ED Triage Notes (Signed)
Pt c/o months of dizziness, left sided chest pressure and low back pain.

## 2017-02-08 NOTE — Discharge Instructions (Signed)
[facility] for recheck of chest pain and ekg.  [phone]

## 2017-02-08 NOTE — ED Provider Notes (Signed)
[facility]PT Provider Note   CSN: [id number] Arrival date & time: 02/08/17  1052   By signing my name below, I, Nelwyn Salisbury, attest that this documentation has been prepared under the direction and in the presence of Bethann Berkshire, MD . Electronically Signed: Nelwyn Salisbury, Scribe. 02/08/2017. 12:31 PM.  History   Chief Complaint Chief Complaint  Patient presents with  . Dizziness  . Chest Pain  . Back Pain   The history is provided by the patient. No language interpreter was used.  Dizziness  Quality:  Room spinning Severity:  Mild Onset quality:  Gradual Timing:  Intermittent Progression:  Worsening Chronicity:  Recurrent Relieved by:  Nothing Worsened by:  Nothing Ineffective treatments:  None tried Associated symptoms: chest pain      HPI Comments:  Ryan [name] is a 31 y.o. male with no pertinent pmhx who presents to the Emergency Department complaining of constant, chronic dizziness beginning a couple months ago. Pt describes his dizziness as if the room is spinning. He reports an associated sensation of "air in his chest" and as if "his veins are going crazy".   Pt secondarily complains of intermittent mild chest pain. He describes his pain as a cramping sensation that comes and goes and radiates into his back.    Past Medical History:  Diagnosis Date  . Anxiety   . Asthma   . Depression     There are no active problems to display for this patient.   History reviewed. No pertinent surgical history.     Home Medications    Prior to Admission medications   Medication Sig Start Date End Date Taking? Authorizing Provider  famotidine (PEPCID) 20 MG tablet Take 1 tablet (20 mg total) by mouth 2 (two) times daily. Patient not taking: Reported on 02/08/2017 11/05/16   Renne Crigler, PA-C  ibuprofen (ADVIL,MOTRIN) 800 MG tablet Take 1 tablet (800 mg total) by mouth 3 (three) times daily. Patient not taking: Reported on 02/08/2017 03/31/14   Tatyana  Kirichenko, PA-C  naproxen (NAPROSYN) 500 MG tablet Take 1 tablet (500 mg total) by mouth 2 (two) times daily. Patient not taking: Reported on 02/08/2017 11/05/16   Renne Crigler, PA-C    Family History No family history on file.  Social History Social History  Substance Use Topics  . Smoking status: Current Every Day Smoker    Packs/day: 1.00    Years: 5.00    Types: Cigarettes  . Smokeless tobacco: Never Used  . Alcohol use 1.2 oz/week    2 Cans of beer per week     Allergies   Strawberry extract and Pollen extract   Review of Systems Review of Systems  Cardiovascular: Positive for chest pain.  Musculoskeletal: Positive for back pain.  Neurological: Positive for dizziness.  All other systems reviewed and are negative.    Physical Exam Updated Vital Signs BP 114/84   Pulse 78   Temp 98 F (36.7 C) (Oral)   Resp 15   Ht 5\' 11"  (1.803 m)   Wt 145 lb (65.8 kg)   SpO2 98%   BMI 20.22 kg/m   Physical Exam  Constitutional: He is oriented to person, place, and time. He appears well-developed.  HENT:  Head: Normocephalic.  Eyes: Conjunctivae and EOM are normal. No scleral icterus.  Neck: Neck supple. No thyromegaly present.  Cardiovascular: Normal rate and regular rhythm.  Exam reveals no gallop and no friction rub.   No murmur heard. Pulmonary/Chest: No stridor. He has no  wheezes. He has no rales. He exhibits no tenderness.  Abdominal: He exhibits no distension. There is no tenderness. There is no rebound.  Musculoskeletal: Normal range of motion. He exhibits no edema.  Lymphadenopathy:    He has no cervical adenopathy.  Neurological: He is oriented to person, place, and time. He exhibits normal muscle tone. Coordination normal.  Skin: No rash noted. No erythema.  Psychiatric: He has a normal mood and affect. His behavior is normal.     ED Treatments / Results  DIAGNOSTIC STUDIES:  Oxygen Saturation is 98% on RA, normal by my interpretation.     COORDINATION OF CARE:  12:37 PM Discussed treatment plan with pt at bedside which includes imaging and pt agreed to plan.  Labs (all labs ordered are listed, but only abnormal results are displayed) Labs Reviewed  CBG MONITORING, ED - Abnormal; Notable for the following:       Result Value   Glucose-Capillary 100 (*)    All other components within normal limits  BASIC METABOLIC PANEL  CBC  URINALYSIS, ROUTINE W REFLEX MICROSCOPIC    EKG  EKG Interpretation None       Radiology No results found.  Procedures Procedures (including critical care time)  Medications Ordered in ED Medications - No data to display   Initial Impression / Assessment and Plan / ED Course  I have reviewed the triage vital signs and the nursing notes.  Pertinent labs & imaging results that were available during my care of the patient were reviewed by me and considered in my medical decision making (see chart for details).     Patient with vertigo symptoms that are improved with meclizine. Labs and x-rays unremarkable. EKG does show some inverted T waves. He is being referred to cardiology for further evaluation  Final Clinical Impressions(s) / ED Diagnoses   Final diagnoses:  None    New Prescriptions New Prescriptions   No medications on file  The chart was scribed for me under my direct supervision.  I personally performed the history, physical, and medical decision making and all procedures in the evaluation of this patient.Bethann Berkshire.     Renesha Lizama, MD 02/08/17 [phone]

## 2017-02-08 NOTE — ED Notes (Signed)
Patient taken to xray.

## 2017-02-11 ENCOUNTER — Emergency Department (HOSPITAL_COMMUNITY): Payer: Self-pay

## 2017-02-11 ENCOUNTER — Ambulatory Visit: Payer: Self-pay | Admitting: Physician Assistant

## 2017-02-11 ENCOUNTER — Emergency Department (HOSPITAL_COMMUNITY)
Admission: EM | Admit: 2017-02-11 | Discharge: 2017-02-11 | Disposition: A | Discharge status: Home / Self Care | Payer: Self-pay | Attending: Emergency Medicine | Admitting: Emergency Medicine

## 2017-02-11 ENCOUNTER — Encounter (HOSPITAL_COMMUNITY): Payer: Self-pay | Admitting: Emergency Medicine

## 2017-02-11 DIAGNOSIS — R42 Dizziness and giddiness: Secondary | ICD-10-CM | POA: Insufficient documentation

## 2017-02-11 DIAGNOSIS — Z79899 Other long term (current) drug therapy: Secondary | ICD-10-CM | POA: Insufficient documentation

## 2017-02-11 DIAGNOSIS — R079 Chest pain, unspecified: Secondary | ICD-10-CM | POA: Insufficient documentation

## 2017-02-11 DIAGNOSIS — J45909 Unspecified asthma, uncomplicated: Secondary | ICD-10-CM | POA: Insufficient documentation

## 2017-02-11 DIAGNOSIS — F17210 Nicotine dependence, cigarettes, uncomplicated: Secondary | ICD-10-CM | POA: Insufficient documentation

## 2017-02-11 LAB — COMPREHENSIVE METABOLIC PANEL
ALBUMIN: 4.3 g/dL (ref 3.5–5.0)
ALT: 22 U/L (ref 17–63)
AST: 26 U/L (ref 15–41)
Alkaline Phosphatase: 64 U/L (ref 38–126)
Anion gap: 10 (ref 5–15)
BUN: 10 mg/dL (ref 6–20)
CHLORIDE: 102 mmol/L (ref 101–111)
CO2: 27 mmol/L (ref 22–32)
Calcium: 9.5 mg/dL (ref 8.9–10.3)
Creatinine, Ser: 1.17 mg/dL (ref 0.61–1.24)
GFR calc Af Amer: >60 mL/min (ref >60)
GLUCOSE: 98 mg/dL (ref 65–99)
POTASSIUM: 3.8 mmol/L (ref 3.5–5.1)
SODIUM: 139 mmol/L (ref 135–145)
Total Bilirubin: 0.8 mg/dL (ref 0.3–1.2)
Total Protein: 7 g/dL (ref 6.5–8.1)

## 2017-02-11 LAB — I-STAT TROPONIN, ED: TROPONIN I, POC: 0 ng/mL (ref 0.00–0.08)

## 2017-02-11 LAB — CBC WITH DIFFERENTIAL/PLATELET
BASOS ABS: 0 10*3/uL (ref 0.0–0.1)
BASOS PCT: 0 %
EOS PCT: 2 %
Eosinophils Absolute: 0.2 10*3/uL (ref 0.0–0.7)
HCT: 43.6 % (ref 39.0–52.0)
Hemoglobin: 15.4 g/dL (ref 13.0–17.0)
Lymphocytes Relative: 43 %
Lymphs Abs: 3.3 10*3/uL (ref 0.7–4.0)
MCH: 31.5 pg (ref 26.0–34.0)
MCHC: 35.3 g/dL (ref 30.0–36.0)
MCV: 89.2 fL (ref 78.0–100.0)
MONO ABS: 0.5 10*3/uL (ref 0.1–1.0)
Monocytes Relative: 6 %
Neutro Abs: 3.8 10*3/uL (ref 1.7–7.7)
Neutrophils Relative %: 49 %
PLATELETS: 226 10*3/uL (ref 150–400)
RBC: 4.89 MIL/uL (ref 4.22–5.81)
RDW: 12.8 % (ref 11.5–15.5)
WBC: 7.7 10*3/uL (ref 4.0–10.5)

## 2017-02-11 MED ORDER — HYDROXYZINE HCL 25 MG PO TABS: 25.0000 mg | ORAL_TABLET | Freq: Four times a day (QID) | ORAL | 0 refills | Status: DC | PRN

## 2017-02-11 MED ORDER — MECLIZINE HCL 25 MG PO TABS
25.0000 mg | ORAL_TABLET | Freq: Once | ORAL | Status: AC
  Administered 2017-02-11: 25 mg via ORAL
  Filled 2017-02-11: qty 1

## 2017-02-11 NOTE — ED Triage Notes (Signed)
Pt c/o left chest pain onset several days ago.  Was seen here for same on 2/11.  Also c/o feeling dizzy.

## 2017-02-11 NOTE — ED Provider Notes (Signed)
[facility] Provider Note   CSN: [id number] Arrival date & time: 02/11/17  0125     History   Chief Complaint Chief Complaint  Patient presents with  . Chest Pain    HPI Ryan [name] is a 31 y.o. male.  Patient presents to the emergency department for evaluation of dizziness, chest pain. He was seen in the ER several days ago with similar complaints. He continues to have the exact same complaints. He reports feeling like he is spinning and falling to the side when he walks. He also reports "air trapped in his chest". He is complaining of "pulling of his veins in his arms". Patient appears very anxious. He does report that he did schedule follow-up with cardiology as previously recommended at discharge. He has not, however, filled any prescriptions.      Past Medical History:  Diagnosis Date  . Anxiety   . Asthma   . Depression     There are no active problems to display for this patient.   History reviewed. No pertinent surgical history.     Home Medications    Prior to Admission medications   Medication Sig Start Date End Date Taking? Authorizing Provider  famotidine (PEPCID) 20 MG tablet Take 1 tablet (20 mg total) by mouth 2 (two) times daily. Patient not taking: Reported on 02/08/2017 11/05/16   Renne CriglerJoshua Geiple, PA-C  ibuprofen (ADVIL,MOTRIN) 800 MG tablet Take 1 tablet (800 mg total) by mouth 3 (three) times daily. Patient not taking: Reported on 02/08/2017 03/31/14   Jaynie Crumbleatyana Kirichenko, PA-C  meclizine (ANTIVERT) 25 MG tablet Take 1 tablet (25 mg total) by mouth 3 (three) times daily as needed for dizziness. 02/08/17   Bethann BerkshireJoseph Zammit, MD  naproxen (NAPROSYN) 500 MG tablet Take 1 tablet (500 mg total) by mouth 2 (two) times daily. Patient not taking: Reported on 02/08/2017 11/05/16   Renne CriglerJoshua Geiple, PA-C    Family History No family history on file.  Social History Social History  Substance Use Topics  . Smoking status: Current Every Day Smoker   Packs/day: 1.00    Years: 5.00    Types: Cigarettes  . Smokeless tobacco: Never Used  . Alcohol use 1.2 oz/week    2 Cans of beer per week     Allergies   Strawberry extract and Pollen extract   Review of Systems Review of Systems  Cardiovascular: Positive for chest pain.  Neurological: Positive for dizziness.  All other systems reviewed and are negative.    Physical Exam Updated Vital Signs BP 111/83 (BP Location: Left Arm)   Pulse 91   Temp 97.4 F (36.3 C) (Oral)   Resp 16   SpO2 100%   Physical Exam  Constitutional: He is oriented to person, place, and time. He appears well-developed and well-nourished. No distress.  HENT:  Head: Normocephalic and atraumatic.  Right Ear: Hearing normal.  Left Ear: Hearing normal.  Nose: Nose normal.  Mouth/Throat: Oropharynx is clear and moist and mucous membranes are normal.  Eyes: Conjunctivae and EOM are normal. Pupils are equal, round, and reactive to light.  Neck: Normal range of motion. Neck supple.  Cardiovascular: Regular rhythm, S1 normal and S2 normal.  Exam reveals no gallop and no friction rub.   No murmur heard. Pulmonary/Chest: Effort normal and breath sounds normal. No respiratory distress. He exhibits no tenderness.  Abdominal: Soft. Normal appearance and bowel sounds are normal. There is no hepatosplenomegaly. There is no tenderness. There is no rebound, no guarding, no tenderness  at McBurney's point and negative Murphy's sign. No hernia.  Musculoskeletal: Normal range of motion.  Neurological: He is alert and oriented to person, place, and time. He has normal strength. No cranial nerve deficit or sensory deficit. Coordination normal. GCS eye subscore is 4. GCS verbal subscore is 5. GCS motor subscore is 6.  Skin: Skin is warm, dry and intact. No rash noted. No cyanosis.  Psychiatric: He has a normal mood and affect. His speech is normal and behavior is normal. Thought content normal.  Nursing note and vitals  reviewed.    ED Treatments / Results  Labs (all labs ordered are listed, but only abnormal results are displayed) Labs Reviewed  CBC WITH DIFFERENTIAL/PLATELET  COMPREHENSIVE METABOLIC PANEL  I-STAT TROPOININ, ED    EKG  EKG Interpretation  Date/Time:  Wednesday February 11 2017 01:33:11 EST Ventricular Rate:  92 PR Interval:  112 QRS Duration: 82 QT Interval:  356 QTC Calculation: 440 R Axis:   89 Text Interpretation:  Normal sinus rhythm with sinus arrhythmia Right atrial enlargement Borderline ECG No significant change since last tracing Confirmed by Salia Cangemi  MD, Daneesha Quinteros [phone]) on 02/11/2017 3:53:18 AM       Radiology Dg Chest 2 View  Result Date: 02/11/2017 CLINICAL DATA:  Left chest pain for several days. Seen here for same on 02/08/2017. Dizziness. EXAM: CHEST  2 VIEW COMPARISON:  02/08/2017 FINDINGS: The heart size and mediastinal contours are within normal limits. Both lungs are clear. The visualized skeletal structures are unremarkable. IMPRESSION: No active cardiopulmonary disease. Electronically Signed   By: Burman Nieves M.D.   On: 02/11/2017 02:01    Procedures Procedures (including critical care time)  Medications Ordered in ED Medications - No data to display   Initial Impression / Assessment and Plan / ED Course  I have reviewed the triage vital signs and the nursing notes.  Pertinent labs & imaging results that were available during my care of the patient were reviewed by me and considered in my medical decision making (see chart for details).     Patient returns with identical problems to those that he complained of when he was seen several days ago. Once again, examination is unremarkable. Workup was entirely normal as well. Patient has reportedly scheduled follow-up with cardiology. He was encouraged to follow-up with cardiology as they may be able to further evaluate his symptoms beyond what can be done in the ER. There is nothing to  indicate acute coronary syndrome, stroke, PE or other significant emergent problem at this time.  Final Clinical Impressions(s) / ED Diagnoses   Final diagnoses:  Vertigo  Chest pain, unspecified type    New Prescriptions New Prescriptions   No medications on file     Gilda Crease, MD 02/11/17 [phone]

## 2017-02-11 NOTE — Progress Notes (Deleted)
Cardiology Office Note    Date:  02/11/2017   ID:  Ryan [name], DOB Mar 28, 1986, MRN [id number]  PCP:  No PCP Per Patient  Cardiologist:  ***   No chief complaint on file.   History of Present Illness:  Ryan [name] is a 31 y.o. male ***   No EKG   Past Medical History:  Diagnosis Date  . Anxiety   . Asthma   . Depression     No past surgical history on file.  Current Medications: Outpatient Medications Prior to Visit  Medication Sig Dispense Refill  . hydrOXYzine (ATARAX/VISTARIL) 25 MG tablet Take 1 tablet (25 mg total) by mouth every 6 (six) hours as needed for anxiety. 12 tablet 0  . meclizine (ANTIVERT) 25 MG tablet Take 1 tablet (25 mg total) by mouth 3 (three) times daily as needed for dizziness. 30 tablet 0   No facility-administered medications prior to visit.      Allergies:   Strawberry extract and Pollen extract   Social History   Social History  . Marital status: Single    Spouse name: N/A  . Number of children: N/A  . Years of education: N/A   Social History Main Topics  . Smoking status: Current Every Day Smoker    Packs/day: 1.00    Years: 5.00    Types: Cigarettes  . Smokeless tobacco: Never Used  . Alcohol use 1.2 oz/week    2 Cans of beer per week  . Drug use: Yes    Types: Marijuana  . Sexual activity: Not on file   Other Topics Concern  . Not on file   Social History Narrative  . No narrative on file     Family History:  The patient's ***family history is not on file.   ROS:   Please see the history of present illness.    ROS All other systems reviewed and are negative.   PHYSICAL EXAM:   VS:  There were no vitals taken for this visit.   GEN: Well nourished, well developed, in no acute distress HEENT: normal Neck: no JVD, carotid bruits, or masses Cardiac: ***RRR; no murmurs, rubs, or gallops,no edema  Respiratory:  clear to auscultation bilaterally, normal work of breathing GI: soft, nontender,  nondistended, + BS MS: no deformity or atrophy Skin: warm and dry, no rash Neuro:  Alert and Oriented x 3, Strength and sensation are intact Psych: euthymic mood, full affect  Wt Readings from Last 3 Encounters:  02/08/17 145 lb (65.8 kg)  03/31/14 135 lb (61.2 kg)      Studies/Labs Reviewed:   EKG:  EKG is*** ordered today.  The ekg ordered today demonstrates ***  Recent Labs: 02/11/2017: ALT 22; BUN 10; Creatinine, Ser 1.17; Hemoglobin 15.4; Platelets 226; Potassium 3.8; Sodium 139   Lipid Panel No results found for: CHOL, TRIG, HDL, CHOLHDL, VLDL, LDLCALC, LDLDIRECT  Additional studies/ records that were reviewed today include:  ***    ASSESSMENT:    No diagnosis found.   PLAN:  In order of problems listed above:  1. ***    Medication Adjustments/Labs and Tests Ordered: Current medicines are reviewed at length with the patient today.  Concerns regarding medicines are outlined above.  Medication changes, Labs and Tests ordered today are listed in the Patient Instructions below. There are no Patient Instructions on file for this visit.   Ramond DialSigned, Jorene Kaylor, [place]  02/11/2017 1:31 PM    [facility] [address], [place], [place]  [address] Phone: [phone]; Fax: [phone]

## 2017-02-11 NOTE — ED Notes (Signed)
Pt AVS reviewed and pt verbalized understanding. Pt reports having cardiology apt today at 1500

## 2017-02-17 ENCOUNTER — Encounter: Payer: Self-pay | Admitting: Cardiology

## 2017-02-17 ENCOUNTER — Ambulatory Visit (INDEPENDENT_AMBULATORY_CARE_PROVIDER_SITE_OTHER): Payer: Self-pay | Admitting: Cardiology

## 2017-02-17 VITALS — BP 126/82 | HR 78 | Ht 70.0 in | Wt 143.6 lb

## 2017-02-17 DIAGNOSIS — F17200 Nicotine dependence, unspecified, uncomplicated: Secondary | ICD-10-CM | POA: Insufficient documentation

## 2017-02-17 DIAGNOSIS — R071 Chest pain on breathing: Secondary | ICD-10-CM | POA: Insufficient documentation

## 2017-02-17 DIAGNOSIS — R079 Chest pain, unspecified: Secondary | ICD-10-CM

## 2017-02-17 DIAGNOSIS — R42 Dizziness and giddiness: Secondary | ICD-10-CM | POA: Insufficient documentation

## 2017-02-17 DIAGNOSIS — R0789 Other chest pain: Secondary | ICD-10-CM

## 2017-02-17 NOTE — Patient Instructions (Signed)
Medication Instructions:  Your physician recommends that you continue on your current medications as directed. Please refer to the Current Medication list given to you today.  Labwork: none  Testing/Procedures: Your physician has requested that you have a stress echocardiogram. For further information please visit [email or web address] Please follow instruction sheet as given. [facility] AT [address]   Follow-Up: Your physician recommends that you schedule a follow-up appointment in: WITH DR [facility] AFTER STRESS ECHO   Exercise Stress Echocardiogram An exercise stress echocardiogram is a test that checks how well your heart is working. For this test, you will walk on a treadmill to make your heart beat faster. This test uses sound waves (ultrasound) and a computer to make pictures (images) of your heart. These pictures will be taken before you exercise and after you exercise. What happens before the procedure?  Follow instructions from your doctor about what you cannot eat or drink before the test.  Do not drink or eat anything that has caffeine in it. Stop having caffeine for 24 hours before the test.  Ask your doctor about changing or stopping your normal medicines. This is important if you take diabetes medicines or blood thinners. Ask your doctor if you should take your medicines with water before the test.  If you use an inhaler, bring it to the test.  Do not use any products that have nicotine or tobacco in them, such as cigarettes and e-cigarettes. Stop using them for 4 hours before the test. If you need help quitting, ask your doctor.  Wear comfortable shoes and clothing. What happens during the procedure?  You will be hooked up to a TV screen. Your doctor will watch the screen to see how fast your heart beats during the test.  Before you exercise, a computer will make a picture of your heart. To do this:  A gel will be put on your chest.  A wand will be  moved over the gel.  Sound waves from the wand will go to the computer to make the picture.  Your will start walking on a treadmill. The treadmill will start at a slow speed. It will get faster a little bit at a time. When you walk faster, your heart will beat faster.  The treadmill will be stopped when your heart is working hard.  You will lie down right away so another picture of your heart can be taken.  The test will take 30-60 minutes. What happens after the procedure?  Your heart rate and blood pressure will be watched after the test.  If your doctor says that you can, you may:  Eat what you usually eat.  Do your normal activities.  Take medicines like normal. Summary  An exercise stress echocardiogram is a test that checks how well your heart is working.  Follow instructions about what you cannot eat or drink before the test. Ask your doctor if you should take your normal medicines before the test.  Stop having caffeine for 24 hours before the test. Do not use anything with nicotine or tobacco in it for 4 hours before the test.  A computer will take a picture of your heart before you walk on a treadmill. It will take another picture when you are done walking.  Your heart rate and blood pressure will be watched after the test. This information is not intended to replace advice given to you by your health care provider. Make sure you discuss any questions you  have with your health care provider. Document Released: 10/12/2009 Document Revised: 09/07/2016 Document Reviewed: 09/07/2016 Elsevier Interactive Patient Education  2017 [employer]

## 2017-02-17 NOTE — Assessment & Plan Note (Signed)
Pt has been seen in the ED in Nov 2017, and twice in Feb 2018 with chest pain and dizziness.

## 2017-02-17 NOTE — Assessment & Plan Note (Signed)
1 PPD 

## 2017-02-17 NOTE — Assessment & Plan Note (Signed)
As above- dizziness and intermittent chest pain x 3 months

## 2017-02-17 NOTE — Progress Notes (Signed)
02/17/2017 Ryan [name]   August 18, 1986  [id number]  Primary Physician No PCP Per Patient Primary Cardiologist: Dr Herbie BaltimoreHarding (new)  HPI:  31 y/o AA male, graduate of [employer] where he played basketball. 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  He has a previous history of anxiety and was hospitalized years ago. He is currently under house arrest (I was in the wrong place at the wrong time). He denies hard drug use but uses marijuana. He has been seen in the ED in Nov 2017, and twice in Feb 2018 with complaints of left sided chest pain and dizziness. He is convinced something serious is wrong. At times he feels like he can't get enough air. He denies tachycardia. Work up so far has included a head CT which was normal, and EKG which was normal, and negative Troponin's. When describing his dizzy spells he says :my whole left side drops out". He has never had any injury from this. His left chest pain is describes as an intermittent "ache" that seems to be worse with coughing or movement.    Current Outpatient Prescriptions  Medication Sig Dispense Refill  . hydrOXYzine (ATARAX/VISTARIL) 25 MG tablet Take 1 tablet (25 mg total) by mouth every 6 (six) hours as needed for anxiety. 12 tablet 0  . meclizine (ANTIVERT) 25 MG tablet Take 1 tablet (25 mg total) by mouth 3 (three) times daily as needed for dizziness. 30 tablet 0   No current facility-administered medications for this visit.     Allergies  Allergen Reactions  . Strawberry Extract Nausea And Vomiting  . Pollen Extract Other (See Comments)    Social History   Social History  . Marital status: Single    Spouse name: N/A  . Number of children: N/A  . Years of education: N/A   Occupational History  . Not on file.   Social History Main Topics  . Smoking status: Former Smoker    Packs/day: 1.00    Years: 5.00    Types: Cigarettes    Start date: 01/27/2017  . Smokeless tobacco: Never Used  . Alcohol use  1.2 oz/week    2 Cans of beer per week  . Drug use: Yes    Types: Marijuana  . Sexual activity: Not on file   Other Topics Concern  . Not on file   Social History Narrative  . No narrative on file     Review of Systems: General: negative for chills, fever, night sweats or weight changes.  Cardiovascular: negative for chest pain, dyspnea on exertion, edema, orthopnea, palpitations, paroxysmal nocturnal dyspnea or shortness of breath Dermatological: negative for rash Respiratory: negative for cough or wheezing Urologic: negative for hematuria Abdominal: negative for nausea, vomiting, diarrhea, bright red blood per rectum, melena, or hematemesis Neurologic: negative for visual changes, syncope, or dizziness All other systems reviewed and are otherwise negative except as noted above.    Blood pressure 126/82, pulse 78, height 5\' 10"  (1.778 m), weight 143 lb 9.6 oz (65.1 kg).  General appearance: alert, very polite, cooperative, and no distress Neck: no carotid bruit and no JVD Lungs: clear to auscultation bilaterally Heart: regular rate and rhythm Abdomen: soft, non-tender; bowel sounds normal; no masses,  no organomegaly Extremities: extremities normal, atraumatic, no cyanosis or edema- Rt ankle has house arrest bracelet Pulses: 2+ and symmetric Skin: Skin color, texture, turgor normal. No rashes or lesions, multiple tattoos Neurologic: Grossly normal  EKG NSR  ASSESSMENT AND PLAN:  Chest pain of uncertain etiology Pt has been seen in the ED in Nov 2017, and twice in Feb 2018 with chest pain and dizziness.  Dizziness of unknown cause As above- dizziness and intermittent chest pain x 3 months  Smoker 1 PPD   PLAN  Discussed with Dr Dorethea Clan. We'll start with a stress echo. If this is negative for ischemia, LVD, or pericarditis consider neurologic evaluation for dizziness.   Corine Shelter PA-C 02/17/2017 2:48 PM

## 2017-03-05 ENCOUNTER — Telehealth (HOSPITAL_COMMUNITY): Payer: Self-pay | Admitting: *Deleted

## 2017-03-05 NOTE — Telephone Encounter (Signed)
Patient given detailed instructions per Stress Test Requisition Sheet for test on 03/10/17 at 2:30.Patient Notified to arrive 30 minutes early, and that it is imperative to arrive on time for appointment to keep from having the test rescheduled.  Patient verbalized understanding. Daneil DolinSharon S Brooks

## 2017-03-10 ENCOUNTER — Ambulatory Visit (HOSPITAL_COMMUNITY): Payer: Self-pay

## 2017-03-10 ENCOUNTER — Other Ambulatory Visit (HOSPITAL_COMMUNITY): Payer: Self-pay

## 2017-03-18 ENCOUNTER — Ambulatory Visit: Payer: Self-pay | Admitting: Cardiology

## 2017-03-19 ENCOUNTER — Telehealth (HOSPITAL_COMMUNITY): Payer: Self-pay | Admitting: *Deleted

## 2017-03-19 NOTE — Telephone Encounter (Signed)
Attempted to call patient regarding upcoming appointment- no answer.  Ryan [name] Jacqueline  

## 2017-03-23 ENCOUNTER — Other Ambulatory Visit (HOSPITAL_COMMUNITY): Payer: Self-pay

## 2017-03-30 ENCOUNTER — Other Ambulatory Visit (HOSPITAL_COMMUNITY): Payer: Self-pay

## 2017-04-06 ENCOUNTER — Ambulatory Visit: Payer: Self-pay | Admitting: Cardiology

## 2017-04-07 ENCOUNTER — Encounter: Payer: Self-pay | Admitting: *Deleted

## 2017-04-09 ENCOUNTER — Telehealth (HOSPITAL_COMMUNITY): Payer: Self-pay | Admitting: *Deleted

## 2017-04-09 NOTE — Telephone Encounter (Signed)
Patient given detailed instructions per Stress Test Requisition Sheet for test on 04/13/17 at 7:30.Patient Notified to arrive 30 minutes early, and that it is imperative to arrive on time for appointment to keep from having the test rescheduled.  Patient verbalized understanding. Ryan [name]

## 2017-04-13 ENCOUNTER — Ambulatory Visit (HOSPITAL_COMMUNITY): Payer: Self-pay | Attending: Cardiology

## 2017-04-13 ENCOUNTER — Ambulatory Visit (HOSPITAL_COMMUNITY): Payer: Self-pay

## 2017-04-13 ENCOUNTER — Telehealth: Payer: Self-pay | Admitting: *Deleted

## 2017-04-13 DIAGNOSIS — R079 Chest pain, unspecified: Secondary | ICD-10-CM

## 2017-04-13 DIAGNOSIS — I493 Ventricular premature depolarization: Secondary | ICD-10-CM | POA: Insufficient documentation

## 2017-04-13 DIAGNOSIS — R42 Dizziness and giddiness: Secondary | ICD-10-CM

## 2017-04-13 NOTE — Telephone Encounter (Signed)
-----   Message from Abelino Derrick, [place] sent at 04/13/2017  4:46 PM EDT ----- Please let the pt know his stress tests was normal. If he continues to have symptoms he should contact his primary care provider about further work up. He should see Dr Herbie Baltimore (not an APP) in a few months in follow up.  Corine Shelter PA-C 04/13/2017 4:45 PM

## 2017-04-13 NOTE — Telephone Encounter (Signed)
Left msg to call.

## 2017-04-21 ENCOUNTER — Encounter: Payer: Self-pay | Admitting: *Deleted

## 2017-04-21 NOTE — Telephone Encounter (Signed)
Letter w results mailed.

## 2017-04-22 ENCOUNTER — Telehealth: Payer: Self-pay | Admitting: Cardiology

## 2017-04-22 NOTE — Telephone Encounter (Signed)
Returned the call to the patient and gave him his results.  Notes recorded by Abelino Derrick, PA-C on 04/13/2017 at 4:46 PM EDT Please let the pt know his stress tests was normal. If he continues to have symptoms he should contact his primary care provider about further work up. He should see Dr Herbie Baltimore (not an APP) in a few months in follow up.  Patient verbalized his understanding and an appointment will be made with Dr. Herbie Baltimore.

## 2017-04-22 NOTE — Telephone Encounter (Signed)
New message ° ° ° °Pt is returning call about results. °

## 2017-05-04 ENCOUNTER — Ambulatory Visit: Payer: Self-pay | Admitting: Cardiology

## 2017-05-22 ENCOUNTER — Ambulatory Visit: Payer: Self-pay | Admitting: Physician Assistant

## 2017-05-22 NOTE — Progress Notes (Deleted)
Cardiology Office Note    Date:  05/22/2017   ID:  Ryan [name], DOB 10/20/1986, MRN [id number]  PCP:  Patient, No Pcp Per  Cardiologist:  Dr. Herbie BaltimoreHarding   No chief complaint on file.   History of Present Illness:  Ryan [name] is a 31 y.o. male with h/o anxiety. 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 He was last seen by Corine ShelterLuke Kilroy PA-C on 02/17/2017. At that time, he was under house arrest. He'll occasionally uses marijuana, but no other illicit drug use. He presented to the hospital several times in October and November 2017 and also 2 more times in February 2018 with complaint of left-sided chest pain and dizziness. He had a head CT that was negative. EKG did not show any obvious ischemic changes. Troponin was negative as well. He underwent stress echocardiogram on 04/13/2017 which was negative for ischemia, he had normal LV function at rest and with stress.  No EKG  Past Medical History:  Diagnosis Date  . Anxiety   . Asthma   . Depression     No past surgical history on file.  Current Medications: Outpatient Medications Prior to Visit  Medication Sig Dispense Refill  . hydrOXYzine (ATARAX/VISTARIL) 25 MG tablet Take 1 tablet (25 mg total) by mouth every 6 (six) hours as needed for anxiety. 12 tablet 0  . meclizine (ANTIVERT) 25 MG tablet Take 1 tablet (25 mg total) by mouth 3 (three) times daily as needed for dizziness. 30 tablet 0   No facility-administered medications prior to visit.      Allergies:   Strawberry extract and Pollen extract   Social History   Social History  . Marital status: Single    Spouse name: N/A  . Number of children: N/A  . Years of education: N/A   Social History Main Topics  . Smoking status: Former Smoker    Packs/day: 1.00    Years: 5.00    Types: Cigarettes    Start date: 01/27/2017  . Smokeless tobacco: Never Used  . Alcohol use 1.2 oz/week    2 Cans of beer per week    . Drug use: Yes    Types: Marijuana  . Sexual activity: Not on file   Other Topics Concern  . Not on file   Social History Narrative  . No narrative on file     Family History:  The patient's ***Family history is unknown by patient.   ROS:   Please see the history of present illness.    ROS All other systems reviewed and are negative.   PHYSICAL EXAM:   VS:  There were no vitals taken for this visit.   GEN: Well nourished, well developed, in no acute distress  HEENT: normal  Neck: no JVD, carotid bruits, or masses Cardiac: ***RRR; no murmurs, rubs, or gallops,no edema  Respiratory:  clear to auscultation bilaterally, normal work of breathing GI: soft, nontender, nondistended, + BS MS: no deformity or atrophy  Skin: warm and dry, no rash Neuro:  Alert and Oriented x 3, Strength and sensation are intact Psych: euthymic mood, full affect  Wt Readings from Last 3 Encounters:  02/17/17 143 lb 9.6 oz (65.1 kg)  02/08/17 145 lb (65.8 kg)  03/31/14 135 lb (61.2 kg)      Studies/Labs Reviewed:   EKG:  EKG is*** ordered today.  The ekg ordered today demonstrates ***  Recent Labs: 02/11/2017: ALT 22;  BUN 10; Creatinine, Ser 1.17; Hemoglobin 15.4; Platelets 226; Potassium 3.8; Sodium 139   Lipid Panel No results found for: CHOL, TRIG, HDL, CHOLHDL, VLDL, LDLCALC, LDLDIRECT  Additional studies/ records that were reviewed today include:   Stress echo 04/13/2017 Impressions:  - Negative stress echo.   No evidence of ischemia   Normal LV function at rest and with stress   ASSESSMENT:    No diagnosis found.   PLAN:  In order of problems listed above:  1. ***    Medication Adjustments/Labs and Tests Ordered: Current medicines are reviewed at length with the patient today.  Concerns regarding medicines are outlined above.  Medication changes, Labs and Tests ordered today are listed in the Patient Instructions below. There are no Patient Instructions on file for  this visit.   Ramond Dial, [place]  05/22/2017 7:00 AM    [facility] [address] [place], [place], [place]  [address] Phone: [phone]; Fax: [phone]

## 2017-06-04 ENCOUNTER — Ambulatory Visit (INDEPENDENT_AMBULATORY_CARE_PROVIDER_SITE_OTHER): Payer: Self-pay | Admitting: Physician Assistant

## 2017-06-04 ENCOUNTER — Encounter: Payer: Self-pay | Admitting: Physician Assistant

## 2017-06-04 VITALS — BP 126/86 | HR 80 | Ht 70.0 in

## 2017-06-04 DIAGNOSIS — Z7689 Persons encountering health services in other specified circumstances: Secondary | ICD-10-CM

## 2017-06-04 DIAGNOSIS — R0789 Other chest pain: Secondary | ICD-10-CM

## 2017-06-04 DIAGNOSIS — R42 Dizziness and giddiness: Secondary | ICD-10-CM

## 2017-06-04 DIAGNOSIS — R200 Anesthesia of skin: Secondary | ICD-10-CM

## 2017-06-04 NOTE — Patient Instructions (Signed)
Medication Instructions:   No changes  Labwork:  TSH today or when lab is open next   Testing/Procedures:  Your physician has requested that you have a carotid duplex. This test is an ultrasound of the carotid arteries in your neck. It looks at blood flow through these arteries that supply the brain with blood. Allow one hour for this exam. There are no restrictions or special instructions.    Follow-Up:  If tests are negative, follow up only as needed with cardiology You have been referred for a primary care physician with [facility]. Someone will contact you to arrange an appointment, but you may also call to establish care at [phone]  If you need a refill on your cardiac medications before your next appointment, please call your pharmacy.

## 2017-06-04 NOTE — Progress Notes (Signed)
Cardiology Office Note    Date:  06/04/2017   ID:  Ryan [name], DOB [birth year], MRN [id number]  PCP:  Patient, No Pcp Per  Cardiologist:  Dr. Herbie Baltimore   Chief Complaint  Patient presents with  . Follow-up    Left side numb at times. Sharp pain in his head recently throughout the day.  Marland Kitchen Headache  . Shortness of Breath    History of Present Illness:  Ryan [name] is a 31 y.o. 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 He occasionally uses marijuana. He has been seen in the ED in November 2017 and 2 more times in February 2018 with complaint of left-sided chest pain and dizziness. He was seen by Corine Shelter PA-C on 02/17/2017. He underwent stress echo on 04/13/2017 which came back negative without any sign of ischemia. Normal LV function at rest and with stress.  Patient presents today for cardiology office visit. He has many areas of complaints. He says everything started in October. He has been having left sided weakness and left facial numbness recently. He is a worried about possible stroke. He also has been having pain and numbness in the right thigh. He says he continued to have chest pain, dizziness and shortness of breath. He described the chest pain as a sharp jabbing sensation in the substernal area that last only a second and goes away. Given the recent negative stress echo, I reassured him that this is very unlikely to be cardiac pain. He says sometimes he has headache, but does not believe his left-sided weakness is associated with migraine. He says he also have history of anxiety as well, he does not take hydroxyzine which is listed under his medication list, he says his symptom is different from the usual anxiety. On physical exam, I do not hear any significant carotid bruit or murmur. I will obtain a carotid ultrasound to definitively rule out any carotid artery disease. My suspicion for endocarditis causing strokelike  symptom is very low, he does not do IV drug. He says sometimes he has tachypalpitation at night, this occurs once every few days, however do not exceed more than 5 minutes. He says he would wake up sweaty and also tachycardic. The symptom he described likely represent sinus or atrial tachycardia. I do not think a beta blocker would be helpful in this case as he likely would have already converted out by himself before the beta blocker take any affect.   I think he will benefit more with establishment of care with a family physician and even reassurance. It is unclear to me if any history of psychiatric or psychological disorders. He seems to be quite paranoid. I will obtain a TSH to rule out hormonal reason for some of his symptom. Otherwise if carotid ultrasound and TSH are normal, I would not recommend any additional cardiac workup. His primary care physician may try hydroxyzine again for antianxiety purpose. He also smoke quite a bit of marijuana on a daily basis, he says it is helping with his symptom.   Past Medical History:  Diagnosis Date  . Anxiety   . Asthma   . Depression     History reviewed. No pertinent surgical history.  Current Medications: Outpatient Medications Prior to Visit  Medication Sig Dispense Refill  . hydrOXYzine (ATARAX/VISTARIL) 25 MG tablet Take 1 tablet (25 mg total) by mouth every 6 (six) hours as needed for anxiety. 12 tablet  0  . meclizine (ANTIVERT) 25 MG tablet Take 1 tablet (25 mg total) by mouth 3 (three) times daily as needed for dizziness. 30 tablet 0   No facility-administered medications prior to visit.      Allergies:   Strawberry extract and Pollen extract   Social History   Social History  . Marital status: Single    Spouse name: N/A  . Number of children: N/A  . Years of education: N/A   Social History Main Topics  . Smoking status: Former Smoker    Packs/day: 1.00    Years: 5.00    Types: Cigarettes    Start date: 01/27/2017  .  Smokeless tobacco: Never Used  . Alcohol use 1.2 oz/week    2 Cans of beer per week  . Drug use: Yes    Types: Marijuana  . Sexual activity: Not Asked   Other Topics Concern  . None   Social History Narrative  . None     Family History:  The patient's Family history is unknown by patient.   ROS:   Please see the history of present illness.    ROS All other systems reviewed and are negative.   PHYSICAL EXAM:   VS:  BP 126/86   Pulse 80   Ht 5\' 10"  (1.778 m)    GEN: Well nourished, well developed, in no acute distress  HEENT: normal  Neck: no JVD, carotid bruits, or masses Cardiac: RRR; no murmurs, rubs, or gallops,no edema  Respiratory:  clear to auscultation bilaterally, normal work of breathing GI: soft, nontender, nondistended, + BS MS: no deformity or atrophy  Skin: warm and dry, no rash Neuro:  Alert and Oriented x 3, Strength and sensation are intact Psych: euthymic mood, full affect  Wt Readings from Last 3 Encounters:  02/17/17 143 lb 9.6 oz (65.1 kg)  02/08/17 145 lb (65.8 kg)  03/31/14 135 lb (61.2 kg)      Studies/Labs Reviewed:   EKG:  EKG is not ordered today.    Recent Labs: 02/11/2017: ALT 22; BUN 10; Creatinine, Ser 1.17; Hemoglobin 15.4; Platelets 226; Potassium 3.8; Sodium 139   Lipid Panel No results found for: CHOL, TRIG, HDL, CHOLHDL, VLDL, LDLCALC, LDLDIRECT  Additional studies/ records that were reviewed today include:   CT of head wo contrast 02/08/2017  FINDINGS: Brain: No evidence of malformation, atrophy, old or acute small or large vessel infarction, mass lesion, hemorrhage, hydrocephalus or extra-axial collection. No evidence of pituitary lesion.  Vascular: No vascular calcification.  No hyperdense vessels.  Skull: Normal.  No fracture or focal bone lesion.  Sinuses/Orbits: Visualized sinuses are clear. No fluid in the middle ears or mastoids. Visualized orbits are normal.  Other: None  significant  IMPRESSION: Normal head CT    Stress echo 03/2017 Impressions:  - Negative stress echo.   No evidence of ischemia   Normal LV function at rest and with stress    ASSESSMENT:    1. Dizziness   2. Establishing care with new doctor, encounter for   3. Atypical chest pain   4. Left sided numbness      PLAN:  In order of problems listed above:  1. Dizziness: Persistent dizziness since October 2017, unclear cause. He described more numbness on the left side of the face and also left extremities as well. We will obtain a carotid Doppler. We will also obtain TSH as well. He is concerned of possible stroke, I did perform a full neural physical exam, I  did not see any significant focal deficit. Although I mentioned some has migraine can cause similar symptoms, he says he does not think it is caused by migraine. He has multiple somatic complaints during today's physical exam, I'm not entirely clear if there has been any previous psychological or psychiatric issues, he does seem to require primary care physician for regular visit. I recommended him to seek cardiology only on an as needed basis, if TSH and carotid Doppler normal, I would not recommend any additional cardiac workup.  2. Atypical chest pain: Described as a jabbing sensation that last only seconds at a time, recent negative stress echo, I reassured the patient and recommended no additional workup.    Medication Adjustments/Labs and Tests Ordered: Current medicines are reviewed at length with the patient today.  Concerns regarding medicines are outlined above.  Medication changes, Labs and Tests ordered today are listed in the Patient Instructions below. Patient Instructions  Medication Instructions:   No changes  Labwork:  TSH today or when lab is open next   Testing/Procedures:  Your physician has requested that you have a carotid duplex. This test is an ultrasound of the carotid arteries in your neck.  It looks at blood flow through these arteries that supply the brain with blood. Allow one hour for this exam. There are no restrictions or special instructions.    Follow-Up:  If tests are negative, follow up only as needed with cardiology You have been referred for a primary care physician with [facility] and [facility]. Someone will contact you to arrange an appointment, but you may also call to establish care at [phone]  If you need a refill on your cardiac medications before your next appointment, please call your pharmacy.      Ramond Dial, [place]  06/04/2017 6:31 PM    [facility] [address] [place], [place], [place]  [address] Phone: [phone]; Fax: [phone]

## 2017-06-19 ENCOUNTER — Ambulatory Visit (HOSPITAL_COMMUNITY): Admission: RE | Admit: 2017-06-19 | Payer: Self-pay | Source: Ambulatory Visit

## 2017-07-16 ENCOUNTER — Ambulatory Visit (HOSPITAL_COMMUNITY)
Admission: RE | Admit: 2017-07-16 | Discharge: 2017-07-16 | Disposition: A | Discharge status: Home / Self Care | Payer: Self-pay | Source: Ambulatory Visit | Attending: Cardiology | Admitting: Cardiology

## 2017-07-16 DIAGNOSIS — R42 Dizziness and giddiness: Secondary | ICD-10-CM | POA: Insufficient documentation

## 2017-07-24 ENCOUNTER — Encounter (HOSPITAL_COMMUNITY): Payer: Self-pay | Admitting: *Deleted

## 2017-07-24 ENCOUNTER — Ambulatory Visit (HOSPITAL_COMMUNITY)
Admission: EM | Admit: 2017-07-24 | Discharge: 2017-07-24 | Disposition: A | Discharge status: Home / Self Care | Payer: Self-pay | Attending: Internal Medicine | Admitting: Internal Medicine

## 2017-07-24 DIAGNOSIS — R05 Cough: Secondary | ICD-10-CM

## 2017-07-24 DIAGNOSIS — R059 Cough, unspecified: Secondary | ICD-10-CM

## 2017-07-24 DIAGNOSIS — M546 Pain in thoracic spine: Secondary | ICD-10-CM

## 2017-07-24 NOTE — ED Provider Notes (Signed)
CSN: [id number]     Arrival date & time 07/24/17  1905 History   None    Chief Complaint  Patient presents with  . Cough   (Consider location/radiation/quality/duration/timing/severity/associated sxs/prior Treatment) 31 year old male with history of anxiety, asthma, depression comes in with 10 month history of dizziness, trouble breathing, cough, congestion, chest pain, back stabbing pain. He states he feels like "someone is pumping air into my chest". He has been worked up multiple times at the ED for the same symptoms with negative chest xrays and ecgs with no acute changes. He has seen a cardiologist, which patient states he was told his heart was not the cause of his symptoms. He denies fever, chills, night sweats. States when he goes home, he has wheezing, trouble breathing, and has to lay on the floor to feel better. He denies nasal congestion, sinus pressure/pain, ear pain, eye pain. But states he coughs "green stuff" up in the morning. Denies injury that could have caused back pain. Denies any illicit drug use.       Past Medical History:  Diagnosis Date  . Anxiety   . Asthma   . Depression    History reviewed. No pertinent surgical history. Family History  Problem Relation Age of Onset  . Family history unknown: Yes   Social History  Substance Use Topics  . Smoking status: Former Smoker    Packs/day: 1.00    Years: 5.00    Types: Cigarettes    Start date: 01/27/2017  . Smokeless tobacco: Never Used  . Alcohol use 1.2 oz/week    2 Cans of beer per week    Review of Systems  Reason unable to perform ROS: See HPI as above.    Allergies  Strawberry extract and Pollen extract  Home Medications   Prior to Admission medications   Medication Sig Start Date End Date Taking? Authorizing Provider  hydrOXYzine (ATARAX/VISTARIL) 25 MG tablet Take 1 tablet (25 mg total) by mouth every 6 (six) hours as needed for anxiety. 02/11/17   Gilda CreasePollina, Christopher J, MD  meclizine  (ANTIVERT) 25 MG tablet Take 1 tablet (25 mg total) by mouth 3 (three) times daily as needed for dizziness. 02/08/17   Bethann BerkshireZammit, Joseph, MD   Meds Ordered and Administered this Visit  Medications - No data to display  BP 130/70 (BP Location: Right Arm)   Pulse 78   Temp 98.6 F (37 C) (Oral)   Resp 18   SpO2 100%  No data found.   Physical Exam  Constitutional: He is oriented to person, place, and time. He appears well-developed and well-nourished. No distress.  HENT:  Head: Normocephalic and atraumatic.  Right Ear: Tympanic membrane, external ear and ear canal normal. Tympanic membrane is not erythematous and not bulging.  Left Ear: Tympanic membrane, external ear and ear canal normal. Tympanic membrane is not erythematous and not bulging.  Nose: Nose normal. Right sinus exhibits no maxillary sinus tenderness and no frontal sinus tenderness. Left sinus exhibits no maxillary sinus tenderness and no frontal sinus tenderness.  Mouth/Throat: Uvula is midline, oropharynx is clear and moist and mucous membranes are normal.  Eyes: Pupils are equal, round, and reactive to light. Conjunctivae are normal.  Neck: Normal range of motion. Neck supple.  Cardiovascular: Normal rate, regular rhythm and normal heart sounds.  Exam reveals no gallop and no friction rub.   No murmur heard. Pulmonary/Chest: Effort normal and breath sounds normal. He has no decreased breath sounds. He has no wheezes.  He has no rhonchi. He has no rales.  Musculoskeletal:  No tenderness on palpation of the midline and bilateral back. Patient without trouble moving.  Lymphadenopathy:    He has no cervical adenopathy.  Neurological: He is alert and oriented to person, place, and time.  Skin: Skin is warm and dry.  Psychiatric: He has a normal mood and affect. His behavior is normal. Judgment normal.    Urgent Care Course     Procedures (including critical care time)  Labs Review Labs Reviewed - No data to  display  Imaging Review No results found.     MDM   1. Cough   2. Thoracic back pain, unspecified back pain laterality, unspecified chronicity    Discussed with patient he will need further workup for his symptoms that is not available to me. Discussed with patient no alarming symptoms today. Discussed with patient given 10 month history of symptoms, negative xray 5 months ago, lung exam normal without adventitious lung sounds, O2 sat 100% at room air, and without fever, low suspicion for pneumonia. Given patient stating he is having productive cough, discussed with patient we could treat for nasal congestion to see if it will help decrease his symptoms. Patient declined treatment and would like to have further evaluation done at the ED.    Belinda FisherYu, Kayler Buckholtz V, PA-C 07/24/17 2038    Belinda FisherYu, Aireanna Luellen V, PA-C 07/24/17 2152

## 2017-07-24 NOTE — ED Notes (Signed)
Pt discharged by provider.

## 2017-08-03 ENCOUNTER — Encounter: Payer: Self-pay | Admitting: *Deleted

## 2017-10-27 ENCOUNTER — Encounter (HOSPITAL_COMMUNITY): Payer: Self-pay

## 2017-10-27 ENCOUNTER — Encounter (HOSPITAL_COMMUNITY): Payer: Self-pay | Admitting: *Deleted

## 2017-10-27 ENCOUNTER — Emergency Department (HOSPITAL_COMMUNITY)
Admission: EM | Admit: 2017-10-27 | Discharge: 2017-10-27 | Discharge status: Against Medical Advice | Payer: Self-pay | Attending: Emergency Medicine | Admitting: Emergency Medicine

## 2017-10-27 ENCOUNTER — Emergency Department (HOSPITAL_COMMUNITY)
Admission: EM | Admit: 2017-10-27 | Discharge: 2017-10-27 | Disposition: A | Discharge status: Home / Self Care | Payer: Self-pay | Attending: Emergency Medicine | Admitting: Emergency Medicine

## 2017-10-27 DIAGNOSIS — R360 Urethral discharge without blood: Secondary | ICD-10-CM | POA: Insufficient documentation

## 2017-10-27 DIAGNOSIS — R109 Unspecified abdominal pain: Secondary | ICD-10-CM | POA: Insufficient documentation

## 2017-10-27 DIAGNOSIS — R369 Urethral discharge, unspecified: Secondary | ICD-10-CM

## 2017-10-27 DIAGNOSIS — Z202 Contact with and (suspected) exposure to infections with a predominantly sexual mode of transmission: Secondary | ICD-10-CM | POA: Insufficient documentation

## 2017-10-27 DIAGNOSIS — Z711 Person with feared health complaint in whom no diagnosis is made: Secondary | ICD-10-CM

## 2017-10-27 DIAGNOSIS — Z5321 Procedure and treatment not carried out due to patient leaving prior to being seen by health care provider: Secondary | ICD-10-CM | POA: Insufficient documentation

## 2017-10-27 DIAGNOSIS — R300 Dysuria: Secondary | ICD-10-CM | POA: Insufficient documentation

## 2017-10-27 DIAGNOSIS — J45909 Unspecified asthma, uncomplicated: Secondary | ICD-10-CM | POA: Insufficient documentation

## 2017-10-27 DIAGNOSIS — Z79899 Other long term (current) drug therapy: Secondary | ICD-10-CM | POA: Insufficient documentation

## 2017-10-27 DIAGNOSIS — Z87891 Personal history of nicotine dependence: Secondary | ICD-10-CM | POA: Insufficient documentation

## 2017-10-27 LAB — URINALYSIS, ROUTINE W REFLEX MICROSCOPIC
Bilirubin Urine: NEGATIVE
Glucose, UA: NEGATIVE mg/dL
Hgb urine dipstick: NEGATIVE
KETONES UR: NEGATIVE mg/dL
Nitrite: NEGATIVE
PROTEIN: NEGATIVE mg/dL
Specific Gravity, Urine: 1.006 (ref 1.005–1.030)
pH: 6 (ref 5.0–8.0)

## 2017-10-27 MED ORDER — LIDOCAINE HCL (PF) 1 % IJ SOLN
1.2000 mL | Freq: Once | INTRAMUSCULAR | Status: AC
  Administered 2017-10-27: 1.2 mL
  Filled 2017-10-27: qty 5

## 2017-10-27 MED ORDER — AZITHROMYCIN 250 MG PO TABS
1000.0000 mg | ORAL_TABLET | Freq: Once | ORAL | Status: AC
  Administered 2017-10-27: 1000 mg via ORAL
  Filled 2017-10-27: qty 4

## 2017-10-27 MED ORDER — CEFTRIAXONE SODIUM 250 MG IJ SOLR
250.0000 mg | Freq: Once | INTRAMUSCULAR | Status: AC
  Administered 2017-10-27: 250 mg via INTRAMUSCULAR
  Filled 2017-10-27: qty 250

## 2017-10-27 NOTE — ED Triage Notes (Signed)
Pt arrives a&o x 4 on arrival; pt c/o abdominal pain with testicle pain x 4 days; pt states he has burning on urination; pt refused blood work at triage; Pt c/o pain at  [employer]7/10-Monique,RN

## 2017-10-27 NOTE — Discharge Instructions (Signed)
You were treated today for gonorrhea and chlamydia, you will be contacted with test results in the next 2-3 days if they are positive. Please notify any partners, continue to use condoms for protection. If symptoms are not improving in the next few days please follow-up with the community health and wellness clinic or the health Department.

## 2017-10-27 NOTE — ED Provider Notes (Signed)
[facility]   CSN: [id number] Arrival date & time: 10/27/17  0919     History   Chief Complaint Chief Complaint  Patient presents with  . Testicle Pain  . Dysuria  . Penile Discharge    HPI Ryan [name] is a 31 y.o. male.  HPI   Patient presents complaining of 3-4 days of burning with urination and 2 days of penile discharge. Patient reports he had unprotected sex about 2 weeks ago, but denies any symptoms until about 4 days ago. Patient reports burning pain when he goes to the restroom, the continues for a few minutes afterwards, as well as urinary frequency. Patient reports yellowish discharge for 2 days. Patient denies any genital lesions, reports he sometimes feels a burning go down into his testicles after urination, but denies any scrotal tenderness or swelling, or testicular tenderness currently. No pain with bowel movements. Patient denies any abdominal pain, no nausea vomiting, no flank pain no fevers or chills.  Past Medical History:  Diagnosis Date  . Anxiety   . Asthma   . Depression     Patient Active Problem List   Diagnosis Date Noted  . Chest pain of uncertain etiology 02/17/2017  . Dizziness of unknown cause 02/17/2017  . Smoker 02/17/2017    History reviewed. No pertinent surgical history.     Home Medications    Prior to Admission medications   Medication Sig Start Date End Date Taking? Authorizing Provider  hydrOXYzine (ATARAX/VISTARIL) 25 MG tablet Take 1 tablet (25 mg total) by mouth every 6 (six) hours as needed for anxiety. 02/11/17   Gilda CreasePollina, Christopher J, MD  meclizine (ANTIVERT) 25 MG tablet Take 1 tablet (25 mg total) by mouth 3 (three) times daily as needed for dizziness. 02/08/17   Bethann BerkshireZammit, Joseph, MD    Family History Family History  Problem Relation Age of Onset  . Family history unknown: Yes    Social History Social History  Substance Use Topics  . Smoking status:  Former Smoker    Packs/day: 1.00    Years: 5.00    Types: Cigarettes    Start date: 01/27/2017  . Smokeless tobacco: Never Used  . Alcohol use 1.2 oz/week    2 Cans of beer per week     Allergies   Strawberry extract and Pollen extract   Review of Systems Review of Systems  Constitutional: Negative for chills and fever.  Gastrointestinal: Negative for abdominal pain, nausea, rectal pain and vomiting.  Genitourinary: Positive for discharge, dysuria, frequency and penile pain. Negative for genital sores, hematuria, penile swelling, scrotal swelling and testicular pain.  Musculoskeletal: Negative for back pain.  Skin: Negative for rash.     Physical Exam Updated Vital Signs BP 118/75 (BP Location: Right Arm)   Pulse 79   Temp 98.3 F (36.8 C) (Oral)   Resp 18   Ht 5\' 10"  (1.778 m)   Wt 65.8 kg (145 lb)   SpO2 100%   BMI 20.81 kg/m   Physical Exam  Constitutional: He appears well-developed and well-nourished. No distress.  HENT:  Head: Normocephalic and atraumatic.  Eyes: Right eye exhibits no discharge. Left eye exhibits no discharge.  Pulmonary/Chest: Effort normal. No respiratory distress.  Abdominal: Soft. Bowel sounds are normal. He exhibits no distension and no mass. There is no tenderness. There is no guarding.  No CVA tenderness  Genitourinary:  Genitourinary Comments: No genital lesions noted, penis with mild tenderness to palpation, urinary meatus  with small amount of yellowish discharge present, or discharge noted on underwear. Scrotum and testicles nontender to palpation, no erythema or swelling  Neurological: He is alert. Coordination normal.  Skin: Skin is warm and dry. He is not diaphoretic.  Psychiatric: He has a normal mood and affect. His behavior is normal.  Nursing note and vitals reviewed.    ED Treatments / Results  Labs (all labs ordered are listed, but only abnormal results are displayed) Labs Reviewed  GC/CHLAMYDIA PROBE AMP ([place])  NOT AT [facility]    EKG  EKG Interpretation None       Radiology No results found.  Procedures Procedures (including critical care time)  Medications Ordered in ED Medications  cefTRIAXone (ROCEPHIN) injection 250 mg (250 mg Intramuscular Given 10/27/17 1238)  azithromycin (ZITHROMAX) tablet 1,000 mg (1,000 mg Oral Given 10/27/17 1238)  lidocaine (PF) (XYLOCAINE) 1 % injection 1.2 mL (1.2 mLs Other Given 10/27/17 1239)     Initial Impression / Assessment and Plan / ED Course  I have reviewed the triage vital signs and the nursing notes.  Pertinent labs & imaging results that were available during my care of the patient were reviewed by me and considered in my medical decision making (see chart for details).  Patient is afebrile without abdominal tenderness, abdominal pain or painful bowel movements to indicate prostatitis.  No tenderness to palpation of the testes or epididymis to suggest orchitis or epididymitis.  STD cultures obtained including gonorrhea and chlamydia, patient offered HIV and syphilis testing but declined. Patient to be discharged with instructions to follow up with PCP. Discussed importance of using protection when sexually active. Pt understands that they have GC/Chlamydia cultures pending and that they will need to inform all sexual partners if results return positive. Patient has been treated prophylactically with azithromycin and Rocephin.    Final Clinical Impressions(s) / ED Diagnoses   Final diagnoses:  Dysuria  Penile discharge  Concern about STD in male without diagnosis    New Prescriptions Discharge Medication List as of 10/27/2017 12:38 PM       Dartha Lodge, PA-C 10/27/17 1835    Tegeler, Canary Brim, MD 10/27/17 [phone]

## 2017-10-27 NOTE — ED Triage Notes (Signed)
Pt reports being here last night but lwbs. Has testicle pain since last night with penile discharge and pain with urination. Had UA done last night.

## 2017-10-27 NOTE — ED Notes (Signed)
Called for vitals no answer °

## 2017-10-28 LAB — GC/CHLAMYDIA PROBE AMP (~~LOC~~) NOT AT ARMC
CHLAMYDIA, DNA PROBE: NEGATIVE
NEISSERIA GONORRHEA: POSITIVE — AB

## 2017-11-02 ENCOUNTER — Emergency Department (HOSPITAL_COMMUNITY): Payer: Self-pay

## 2017-11-02 ENCOUNTER — Emergency Department (HOSPITAL_COMMUNITY)
Admission: EM | Admit: 2017-11-02 | Discharge: 2017-11-02 | Disposition: A | Discharge status: Home / Self Care | Payer: Self-pay | Attending: Emergency Medicine | Admitting: Emergency Medicine

## 2017-11-02 ENCOUNTER — Encounter (HOSPITAL_COMMUNITY): Payer: Self-pay

## 2017-11-02 ENCOUNTER — Other Ambulatory Visit: Payer: Self-pay

## 2017-11-02 DIAGNOSIS — R079 Chest pain, unspecified: Secondary | ICD-10-CM

## 2017-11-02 DIAGNOSIS — Z87891 Personal history of nicotine dependence: Secondary | ICD-10-CM | POA: Insufficient documentation

## 2017-11-02 DIAGNOSIS — Z79899 Other long term (current) drug therapy: Secondary | ICD-10-CM | POA: Insufficient documentation

## 2017-11-02 DIAGNOSIS — R0789 Other chest pain: Secondary | ICD-10-CM | POA: Insufficient documentation

## 2017-11-02 DIAGNOSIS — J45909 Unspecified asthma, uncomplicated: Secondary | ICD-10-CM | POA: Insufficient documentation

## 2017-11-02 LAB — BASIC METABOLIC PANEL
Anion gap: 7 (ref 5–15)
BUN: 10 mg/dL (ref 6–20)
CO2: 28 mmol/L (ref 22–32)
Calcium: 9.7 mg/dL (ref 8.9–10.3)
Chloride: 105 mmol/L (ref 101–111)
Creatinine, Ser: 1.14 mg/dL (ref 0.61–1.24)
GFR calc Af Amer: >60 mL/min (ref >60)
GFR calc non Af Amer: >60 mL/min (ref >60)
Glucose, Bld: 97 mg/dL (ref 65–99)
Potassium: 4.1 mmol/L (ref 3.5–5.1)
Sodium: 140 mmol/L (ref 135–145)

## 2017-11-02 LAB — CBC
HCT: 44.2 % (ref 39.0–52.0)
Hemoglobin: 15.3 g/dL (ref 13.0–17.0)
MCH: 31.3 pg (ref 26.0–34.0)
MCHC: 34.6 g/dL (ref 30.0–36.0)
MCV: 90.4 fL (ref 78.0–100.0)
Platelets: 256 10*3/uL (ref 150–400)
RBC: 4.89 MIL/uL (ref 4.22–5.81)
RDW: 13.2 % (ref 11.5–15.5)
WBC: 6.9 10*3/uL (ref 4.0–10.5)

## 2017-11-02 LAB — I-STAT TROPONIN, ED: Troponin i, poc: 0 ng/mL (ref 0.00–0.08)

## 2017-11-02 NOTE — ED Triage Notes (Signed)
Pt name called x 2- pt outside smoking.

## 2017-11-02 NOTE — ED Notes (Signed)
Pt verbalized understanding discharge instructions and denies any further needs or questions at this time. VS stable, ambulatory and steady gait.   

## 2017-11-02 NOTE — ED Triage Notes (Signed)
Pt states chest pain x 1 year. Has been seen for pain multiple times but reports it is sharp stabbing and he needs to know what is stabbing him in his chest.

## 2017-11-02 NOTE — ED Provider Notes (Signed)
[facility] Provider Note   CSN: [id number] Arrival date & time: 11/02/17  1151     History   Chief Complaint Chief Complaint  Patient presents with  . Chest Pain    HPI Ryan [name] is a 31 y.o. [name].  HPI   Chest pain.  Intermittent for over a year.  Worse in the last week.  He describes sharp, pleuritic pain in left anterior chest.  No appreciable exacerbating relieving factors.  No fevers or chills.  No cough.  No unusual leg pain or swelling.  Denies past history of DVT/PE.  He had a normal exercise stress test in April of this year.  Past Medical History:  Diagnosis Date  . Anxiety   . Asthma   . Depression     Patient Active Problem List   Diagnosis Date Noted  . Chest pain of uncertain etiology 02/17/2017  . Dizziness of unknown cause 02/17/2017  . Smoker 02/17/2017    History reviewed. No pertinent surgical history.     Home Medications    Prior to Admission medications   Medication Sig Start Date End Date Taking? Authorizing Provider  hydrOXYzine (ATARAX/VISTARIL) 25 MG tablet Take 1 tablet (25 mg total) by mouth every 6 (six) hours as needed for anxiety. 02/11/17   Gilda CreasePollina, Christopher J, MD  meclizine (ANTIVERT) 25 MG tablet Take 1 tablet (25 mg total) by mouth 3 (three) times daily as needed for dizziness. 02/08/17   Bethann BerkshireZammit, Joseph, MD    Family History Family History  Family history unknown: Yes    Social History Social History   Tobacco Use  . Smoking status: Former Smoker    Packs/day: 1.00    Years: 5.00    Pack years: 5.00    Types: Cigarettes    Start date: 01/27/2017  . Smokeless tobacco: Never Used  Substance Use Topics  . Alcohol use: Yes    Alcohol/week: 1.2 oz    Types: 2 Cans of beer per week  . Drug use: Yes    Types: Marijuana     Allergies   Strawberry extract and Pollen extract   Review of Systems Review of Systems  All systems reviewed and negative, other than as  noted in HPI. Physical Exam Updated Vital Signs BP 124/82 (BP Location: Right Arm)   Pulse 74   Temp 98.5 F (36.9 C) (Oral)   Resp 16   Ht 5\' 10"  (1.778 m)   Wt 65.8 kg (145 lb)   SpO2 99%   BMI 20.81 kg/m   Physical Exam  Constitutional: He appears well-developed and well-nourished. No distress.  HENT:  Head: Normocephalic and atraumatic.  Eyes: Conjunctivae are normal. Right eye exhibits no discharge. Left eye exhibits no discharge.  Neck: Neck supple.  Cardiovascular: Normal rate, regular rhythm and normal heart sounds. Exam reveals no gallop and no friction rub.  No murmur heard. Pulmonary/Chest: Effort normal and breath sounds normal. No respiratory distress.  Abdominal: Soft. He exhibits no distension. There is no tenderness.  Musculoskeletal: He exhibits no edema or tenderness.  Lower extremities symmetric as compared to each other. No calf tenderness. Negative Homan's. No palpable cords.   Neurological: He is alert.  Skin: Skin is warm and dry.  Psychiatric: He has a normal mood and affect. His behavior is normal. Thought content normal.  Nursing note and vitals reviewed.    ED Treatments / Results  Labs (all labs ordered are listed, but only abnormal results are displayed)  Labs Reviewed  BASIC METABOLIC PANEL  CBC  I-STAT TROPONIN, ED    EKG  EKG Interpretation  Date/Time:  Monday November 02 2017 12:21:30 EST Ventricular Rate:  77 PR Interval:  112 QRS Duration: 84 QT Interval:  366 QTC Calculation: 414 R Axis:   88 Text Interpretation:  Normal sinus rhythm Normal ECG Confirmed by Raeford Razor [phone]) on 11/02/2017 1:30:19 PM       Radiology Dg Chest 2 View  Result Date: 11/02/2017 CLINICAL DATA:  Chest pain and wheezing EXAM: CHEST  2 VIEW COMPARISON:  February 11, 2017 FINDINGS: There is no appreciable edema or consolidation. The heart size and pulmonary vascularity are normal. No adenopathy. No bone lesions. No pneumothorax. IMPRESSION: No  edema or consolidation. Electronically Signed   By: Bretta Bang III M.D.   On: 11/02/2017 13:09    Procedures Procedures (including critical care time)  Medications Ordered in ED Medications - No data to display   Initial Impression / Assessment and Plan / ED Course  I have reviewed the triage vital signs and the nursing notes.  Pertinent labs & imaging results that were available during my care of the patient were reviewed by me and considered in my medical decision making (see chart for details).     31 year old [name] with pleuritic sounding chest pain.  Afebrile.  No increased work of breathing.  He remained medically stable.  ED workup fairly unremarkable.  Long-standing history of chest pain.  Normal stress test in April.  I doubt emergent process currently.  I tried to reassure.  Emergent return precautions were discussed.  Final Clinical Impressions(s) / ED Diagnoses   Final diagnoses:  Chest pain, unspecified type    ED Discharge Orders    None       Raeford Razor, MD 11/02/17 1401

## 2017-11-26 ENCOUNTER — Ambulatory Visit (HOSPITAL_COMMUNITY)
Admission: EM | Admit: 2017-11-26 | Discharge: 2017-11-26 | Disposition: A | Discharge status: Home / Self Care | Payer: Self-pay | Attending: Physician Assistant | Admitting: Physician Assistant

## 2017-11-26 ENCOUNTER — Encounter (HOSPITAL_COMMUNITY): Payer: Self-pay | Admitting: *Deleted

## 2017-11-26 DIAGNOSIS — Z202 Contact with and (suspected) exposure to infections with a predominantly sexual mode of transmission: Secondary | ICD-10-CM | POA: Insufficient documentation

## 2017-11-26 DIAGNOSIS — R300 Dysuria: Secondary | ICD-10-CM | POA: Insufficient documentation

## 2017-11-26 DIAGNOSIS — Z87891 Personal history of nicotine dependence: Secondary | ICD-10-CM | POA: Insufficient documentation

## 2017-11-26 LAB — POCT URINALYSIS DIP (DEVICE)
Bilirubin Urine: NEGATIVE
GLUCOSE, UA: NEGATIVE mg/dL
Hgb urine dipstick: NEGATIVE
Ketones, ur: NEGATIVE mg/dL
LEUKOCYTES UA: NEGATIVE
NITRITE: NEGATIVE
Protein, ur: NEGATIVE mg/dL
Specific Gravity, Urine: 1.025 (ref 1.005–1.030)
UROBILINOGEN UA: 0.2 mg/dL (ref 0.0–1.0)
pH: 5.5 (ref 5.0–8.0)

## 2017-11-26 MED ORDER — DOXYCYCLINE HYCLATE 100 MG PO CAPS: 100.0000 mg | ORAL_CAPSULE | Freq: Two times a day (BID) | ORAL | 0 refills | Status: DC

## 2017-11-26 NOTE — ED Provider Notes (Signed)
[facility]    CSN: [id number] Arrival date & time: 11/26/17  1222     History   Chief Complaint Chief Complaint  Patient presents with  . Exposure to STD    HPI Ryan [name] is a 31 y.o. male.   31 year-old male, presenting today complaining of dysuria and penile discharge. He states that about a month ago, he was treated for gonorrhea. He states he completed his antibiotics and his symptoms resolved. He had recurrence of symptoms with dysuria and clear penile discharge about two weeks ago. Denies abdominal pain, testicular pain/swelling, hematuria. No fever or chills    The history is provided by the patient.  Exposure to STD  This is a new problem. The current episode started more than 1 week ago. The problem occurs constantly. The problem has not changed since onset.Pertinent negatives include no chest pain, no abdominal pain, no headaches and no shortness of breath. Nothing aggravates the symptoms. Nothing relieves the symptoms. He has tried nothing for the symptoms. The treatment provided no relief.    Past Medical History:  Diagnosis Date  . Anxiety   . Asthma   . Depression     Patient Active Problem List   Diagnosis Date Noted  . Chest pain of uncertain etiology 02/17/2017  . Dizziness of unknown cause 02/17/2017  . Smoker 02/17/2017    History reviewed. No pertinent surgical history.     Home Medications    Prior to Admission medications   Medication Sig Start Date End Date Taking? Authorizing Provider  doxycycline (VIBRAMYCIN) 100 MG capsule Take 1 capsule (100 mg total) by mouth 2 (two) times daily. 11/26/17   Blue, Olivia C, PA-C  hydrOXYzine (ATARAX/VISTARIL) 25 MG tablet Take 1 tablet (25 mg total) by mouth every 6 (six) hours as needed for anxiety. 02/11/17   Gilda CreasePollina, Christopher J, MD  meclizine (ANTIVERT) 25 MG tablet Take 1 tablet (25 mg total) by mouth 3 (three) times daily as needed for dizziness. 02/08/17   Bethann BerkshireZammit, Joseph, MD     Family History Family History  Family history unknown: Yes    Social History Social History   Tobacco Use  . Smoking status: Former Smoker    Packs/day: 1.00    Years: 5.00    Pack years: 5.00    Types: Cigarettes    Start date: 01/27/2017  . Smokeless tobacco: Never Used  Substance Use Topics  . Alcohol use: Yes    Alcohol/week: 1.2 oz    Types: 2 Cans of beer per week  . Drug use: Yes    Types: Marijuana     Allergies   Strawberry extract and Pollen extract   Review of Systems Review of Systems  Constitutional: Negative for chills and fever.  HENT: Negative for ear pain and sore throat.   Eyes: Negative for pain and visual disturbance.  Respiratory: Negative for cough and shortness of breath.   Cardiovascular: Negative for chest pain and palpitations.  Gastrointestinal: Negative for abdominal pain and vomiting.  Genitourinary: Positive for discharge and dysuria. Negative for hematuria.  Musculoskeletal: Negative for arthralgias and back pain.  Skin: Negative for color change and rash.  Neurological: Negative for seizures, syncope and headaches.  All other systems reviewed and are negative.    Physical Exam Triage Vital Signs ED Triage Vitals  Enc Vitals Group     BP      Pulse      Resp      Temp  Temp src      SpO2      Weight      Height      Head Circumference      Peak Flow      Pain Score      Pain Loc      Pain Edu?      Excl. in GC?    No data found.  Updated Vital Signs There were no vitals taken for this visit.  Visual Acuity Right Eye Distance:   Left Eye Distance:   Bilateral Distance:    Right Eye Near:   Left Eye Near:    Bilateral Near:     Physical Exam  Constitutional: He appears well-developed and well-nourished.  HENT:  Head: Normocephalic and atraumatic.  Eyes: Conjunctivae are normal.  Neck: Neck supple.  Cardiovascular: Normal rate and regular rhythm.  No murmur heard. Pulmonary/Chest: Effort normal  and breath sounds normal. No respiratory distress.  Abdominal: Soft. There is no tenderness.  Musculoskeletal: He exhibits no edema.  Neurological: He is alert.  Skin: Skin is warm and dry.  Psychiatric: He has a normal mood and affect.  Nursing note and vitals reviewed.    UC Treatments / Results  Labs (all labs ordered are listed, but only abnormal results are displayed) Labs Reviewed  POCT URINALYSIS DIP (DEVICE)  URINE CYTOLOGY ANCILLARY ONLY    EKG  EKG Interpretation None       Radiology No results found.  Procedures Procedures (including critical care time)  Medications Ordered in UC Medications - No data to display   Initial Impression / Assessment and Plan / UC Course  I have reviewed the triage vital signs and the nursing notes.  Pertinent labs & imaging results that were available during my care of the patient were reviewed by me and considered in my medical decision making (see chart for details).     Normal Ua without UTI Will treat with doxycyline for possible prostatitis given ongoing symptoms   Final Clinical Impressions(s) / UC Diagnoses   Final diagnoses:  Dysuria    ED Discharge Orders        Ordered    doxycycline (VIBRAMYCIN) 100 MG capsule  2 times daily     11/26/17 1339       Controlled Substance Prescriptions [place] Controlled Substance Registry consulted? Not Applicable   Alecia LemmingBlue, Olivia C, [place]PA-C 11/26/17 1356

## 2017-11-26 NOTE — ED Triage Notes (Addendum)
Patient states that he tested positive for gonorrhea. Was treated. States however he still has burning with urination and clear penile discharge. Patient states that he also feels like he is having trouble voiding.

## 2017-11-27 LAB — URINE CYTOLOGY ANCILLARY ONLY
Chlamydia: NEGATIVE
Neisseria Gonorrhea: NEGATIVE
Trichomonas: NEGATIVE

## 2018-01-25 ENCOUNTER — Emergency Department (HOSPITAL_COMMUNITY): Payer: Self-pay

## 2018-01-25 ENCOUNTER — Encounter (HOSPITAL_COMMUNITY): Payer: Self-pay | Admitting: Emergency Medicine

## 2018-01-25 ENCOUNTER — Emergency Department (HOSPITAL_COMMUNITY)
Admission: EM | Admit: 2018-01-25 | Discharge: 2018-01-25 | Disposition: A | Discharge status: Home / Self Care | Payer: Self-pay | Attending: Emergency Medicine | Admitting: Emergency Medicine

## 2018-01-25 DIAGNOSIS — Z5321 Procedure and treatment not carried out due to patient leaving prior to being seen by health care provider: Secondary | ICD-10-CM | POA: Insufficient documentation

## 2018-01-25 DIAGNOSIS — R0789 Other chest pain: Secondary | ICD-10-CM | POA: Insufficient documentation

## 2018-01-25 LAB — CBC
HCT: 42.1 % (ref 39.0–52.0)
Hemoglobin: 14.8 g/dL (ref 13.0–17.0)
MCH: 32 pg (ref 26.0–34.0)
MCHC: 35.2 g/dL (ref 30.0–36.0)
MCV: 90.9 fL (ref 78.0–100.0)
Platelets: 225 10*3/uL (ref 150–400)
RBC: 4.63 MIL/uL (ref 4.22–5.81)
RDW: 13.2 % (ref 11.5–15.5)
WBC: 7.1 10*3/uL (ref 4.0–10.5)

## 2018-01-25 LAB — BASIC METABOLIC PANEL
Anion gap: 13 (ref 5–15)
BUN: 8 mg/dL (ref 6–20)
CALCIUM: 9.1 mg/dL (ref 8.9–10.3)
CHLORIDE: 102 mmol/L (ref 101–111)
CO2: 22 mmol/L (ref 22–32)
CREATININE: 1.13 mg/dL (ref 0.61–1.24)
Glucose, Bld: 110 mg/dL — ABNORMAL HIGH (ref 65–99)
Potassium: 3.8 mmol/L (ref 3.5–5.1)
SODIUM: 137 mmol/L (ref 135–145)

## 2018-01-25 LAB — I-STAT TROPONIN, ED: TROPONIN I, POC: 0 ng/mL (ref 0.00–0.08)

## 2018-01-25 NOTE — ED Triage Notes (Signed)
Reports left sided sharp chest pain that has been on and off for a year.  Has been seen for the same before.

## 2018-01-25 NOTE — ED Notes (Signed)
Pt stated he needed to go pick up his son. Pt stated he would be back. Pt left ER.

## 2018-01-29 NOTE — ED Notes (Signed)
Called listed phone number for a follow-up call.  No answer, unable to leave a message.

## 2018-12-05 ENCOUNTER — Emergency Department (HOSPITAL_COMMUNITY)
Admission: EM | Admit: 2018-12-05 | Discharge: 2018-12-05 | Disposition: A | Discharge status: Home / Self Care | Payer: Self-pay | Attending: Emergency Medicine | Admitting: Emergency Medicine

## 2018-12-05 ENCOUNTER — Encounter (HOSPITAL_COMMUNITY): Payer: Self-pay | Admitting: Emergency Medicine

## 2018-12-05 ENCOUNTER — Other Ambulatory Visit: Payer: Self-pay

## 2018-12-05 ENCOUNTER — Emergency Department (HOSPITAL_COMMUNITY): Payer: Self-pay

## 2018-12-05 ENCOUNTER — Ambulatory Visit (HOSPITAL_COMMUNITY)
Admission: EM | Admit: 2018-12-05 | Discharge: 2018-12-05 | Disposition: A | Discharge status: Home / Self Care | Payer: Self-pay

## 2018-12-05 DIAGNOSIS — R0789 Other chest pain: Secondary | ICD-10-CM | POA: Insufficient documentation

## 2018-12-05 DIAGNOSIS — R825 Elevated urine levels of drugs, medicaments and biological substances: Secondary | ICD-10-CM | POA: Insufficient documentation

## 2018-12-05 DIAGNOSIS — F17210 Nicotine dependence, cigarettes, uncomplicated: Secondary | ICD-10-CM | POA: Insufficient documentation

## 2018-12-05 DIAGNOSIS — R0602 Shortness of breath: Secondary | ICD-10-CM | POA: Insufficient documentation

## 2018-12-05 DIAGNOSIS — F1290 Cannabis use, unspecified, uncomplicated: Secondary | ICD-10-CM | POA: Insufficient documentation

## 2018-12-05 DIAGNOSIS — T43625A Adverse effect of amphetamines, initial encounter: Secondary | ICD-10-CM | POA: Insufficient documentation

## 2018-12-05 DIAGNOSIS — R42 Dizziness and giddiness: Secondary | ICD-10-CM | POA: Insufficient documentation

## 2018-12-05 LAB — RAPID URINE DRUG SCREEN, HOSP PERFORMED
AMPHETAMINES: POSITIVE — AB
BENZODIAZEPINES: NOT DETECTED
Barbiturates: NOT DETECTED
COCAINE: NOT DETECTED
Opiates: NOT DETECTED
TETRAHYDROCANNABINOL: POSITIVE — AB

## 2018-12-05 LAB — D-DIMER, QUANTITATIVE: D-Dimer, Quant: <0.27 ug/mL-FEU (ref 0.00–0.50)

## 2018-12-05 LAB — BASIC METABOLIC PANEL
Anion gap: 20 — ABNORMAL HIGH (ref 5–15)
BUN: 9 mg/dL (ref 6–20)
CHLORIDE: 93 mmol/L — AB (ref 98–111)
CO2: 21 mmol/L — ABNORMAL LOW (ref 22–32)
CREATININE: 1.19 mg/dL (ref 0.61–1.24)
Calcium: 10.6 mg/dL — ABNORMAL HIGH (ref 8.9–10.3)
GFR calc Af Amer: >60 mL/min (ref >60)
GFR calc non Af Amer: >60 mL/min (ref >60)
GLUCOSE: 107 mg/dL — AB (ref 70–99)
Potassium: 3.6 mmol/L (ref 3.5–5.1)
SODIUM: 134 mmol/L — AB (ref 135–145)

## 2018-12-05 LAB — I-STAT TROPONIN, ED
Troponin i, poc: 0 ng/mL (ref 0.00–0.08)
Troponin i, poc: 0 ng/mL (ref 0.00–0.08)

## 2018-12-05 LAB — CBC
HCT: 49 % (ref 39.0–52.0)
Hemoglobin: 17.4 g/dL — ABNORMAL HIGH (ref 13.0–17.0)
MCH: 31.2 pg (ref 26.0–34.0)
MCHC: 35.5 g/dL (ref 30.0–36.0)
MCV: 88 fL (ref 80.0–100.0)
Platelets: 310 10*3/uL (ref 150–400)
RBC: 5.57 MIL/uL (ref 4.22–5.81)
RDW: 12.3 % (ref 11.5–15.5)
WBC: 8.2 10*3/uL (ref 4.0–10.5)
nRBC: 0 % (ref 0.0–0.2)

## 2018-12-05 MED ORDER — SODIUM CHLORIDE 0.9 % IV BOLUS
1000.0000 mL | Freq: Once | INTRAVENOUS | Status: AC
  Administered 2018-12-05: 1000 mL via INTRAVENOUS

## 2018-12-05 MED ORDER — LORAZEPAM 2 MG/ML IJ SOLN
0.5000 mg | Freq: Once | INTRAMUSCULAR | Status: AC
  Administered 2018-12-05: 0.5 mg via INTRAVENOUS
  Filled 2018-12-05: qty 1

## 2018-12-05 MED ORDER — LORAZEPAM 2 MG/ML IJ SOLN: 1.0000 mg | Freq: Once | INTRAMUSCULAR | Status: DC

## 2018-12-05 NOTE — ED Notes (Signed)
Patient verbalizes understanding of discharge instructions. Opportunity for questioning and answers were provided. Armband removed by staff, pt discharged from ED in wheelchair.  

## 2018-12-05 NOTE — ED Notes (Signed)
Patient transported to X-ray 

## 2018-12-05 NOTE — Discharge Instructions (Signed)
Follow-up with primary care provider, referral given needed.  Return to ER for new or worsening symptoms. Symptoms likely due to amphetamine.  Avoid drug use to prevent future episodes.

## 2018-12-05 NOTE — ED Triage Notes (Signed)
Pt came up to the desk and stated he felt like he cant breathe, pt resp equal and unlabored, pt states his hands feel funny, his face feels funny, pt appears under the influence something. Pt poor historian, poor answering questions. Pt left for ER with family member.

## 2018-12-05 NOTE — ED Triage Notes (Signed)
C/O of chest pain X 1 week. Reports use of marijuana.

## 2018-12-05 NOTE — ED Provider Notes (Signed)
[facility] Note   CSN: [id number] Arrival date & time: 12/05/18  1505     History   Chief Complaint Chief Complaint  Patient presents with  . Chest Pain    HPI Timohty A Kennith CenterLiggins is a 32 y.o. male.  32 year old male with history of anxiety, asthma, depression presents with complaint of left-sided chest pain x1 week, constant, described as "heartburn."  Patient states that he went to urgent care today for worsening chest discomfort and while waiting in urgent care began to feel like his face was swelling and tingly, hands feel clammy, dizzy, felt he could not breathe so he came to the emergency room.  Patient reports marijuana use, denies cocaine or other drug use, has not had any alcohol today, has not had anything to eat today either.  Patient states he has had episode of chest pain similar to this in the past but unable to give any specific details.  Patient is a daily smoker, unknown family history.  No other complaints or concerns.     Past Medical History:  Diagnosis Date  . Anxiety   . Asthma   . Depression     Patient Active Problem List   Diagnosis Date Noted  . Chest pain of uncertain etiology 02/17/2017  . Dizziness of unknown cause 02/17/2017  . Smoker 02/17/2017    History reviewed. No pertinent surgical history.      Home Medications    Prior to Admission medications   Medication Sig Start Date End Date Taking? Authorizing Provider  doxycycline (VIBRAMYCIN) 100 MG capsule Take 1 capsule (100 mg total) by mouth 2 (two) times daily. 11/26/17   Blue, Olivia C, PA-C  hydrOXYzine (ATARAX/VISTARIL) 25 MG tablet Take 1 tablet (25 mg total) by mouth every 6 (six) hours as needed for anxiety. 02/11/17   Gilda CreasePollina, Christopher J, MD  meclizine (ANTIVERT) 25 MG tablet Take 1 tablet (25 mg total) by mouth 3 (three) times daily as needed for dizziness. 02/08/17   Bethann BerkshireZammit, Joseph, MD    Family History Family History  Family  history unknown: Yes    Social History Social History   Tobacco Use  . Smoking status: Former Smoker    Packs/day: 1.00    Years: 5.00    Pack years: 5.00    Types: Cigarettes    Start date: 01/27/2017  . Smokeless tobacco: Never Used  Substance Use Topics  . Alcohol use: No    Alcohol/week: 2.0 standard drinks    Types: 2 Cans of beer per week    Frequency: Never    Comment: quit a few months ago.  . Drug use: Yes    Types: Marijuana     Allergies   Strawberry extract and Pollen extract   Review of Systems Review of Systems  Constitutional: Positive for diaphoresis. Negative for chills and fever.  Respiratory: Positive for shortness of breath.   Cardiovascular: Positive for chest pain. Negative for palpitations and leg swelling.  Gastrointestinal: Negative for abdominal pain, nausea and vomiting.  Genitourinary: Negative for decreased urine volume and difficulty urinating.  Musculoskeletal: Negative for arthralgias and myalgias.  Skin: Negative for rash.  Allergic/Immunologic: Negative for immunocompromised state.  Neurological: Positive for dizziness and numbness. Negative for weakness.  Psychiatric/Behavioral: Negative for confusion. The patient is nervous/anxious.   All other systems reviewed and are negative.    Physical Exam Updated Vital Signs BP 129/87 (BP Location: Right Arm)   Pulse (!) 104   Temp  97.7 F (36.5 C) (Oral)   Resp (!) 22   Ht 5\' 10"  (1.778 m)   Wt 61.2 kg   SpO2 100%   BMI 19.37 kg/m   Physical Exam  Constitutional: He is oriented to person, place, and time. He appears well-developed and well-nourished. No distress.  HENT:  Head: Normocephalic and atraumatic.  Neck: Neck supple.  Cardiovascular: Regular rhythm. Tachycardia present.  No murmur heard. Pulmonary/Chest: Effort normal. He has no decreased breath sounds. He has no wheezes. He exhibits no bony tenderness.  Abdominal: Soft. There is no tenderness.  Musculoskeletal:        Right lower leg: Normal. He exhibits no tenderness and no edema.       Left lower leg: Normal. He exhibits no tenderness and no edema.  Neurological: He is alert and oriented to person, place, and time.  Skin: Skin is warm and dry. He is not diaphoretic.  Psychiatric: His speech is normal and behavior is normal. His mood appears anxious.  Nursing note and vitals reviewed.    ED Treatments / Results  Labs (all labs ordered are listed, but only abnormal results are displayed) Labs Reviewed  BASIC METABOLIC PANEL - Abnormal; Notable for the following components:      Result Value   Sodium 134 (*)    Chloride 93 (*)    CO2 21 (*)    Glucose, Bld 107 (*)    Calcium 10.6 (*)    Anion gap 20 (*)    All other components within normal limits  CBC - Abnormal; Notable for the following components:   Hemoglobin 17.4 (*)    All other components within normal limits  RAPID URINE DRUG SCREEN, HOSP PERFORMED - Abnormal; Notable for the following components:   Amphetamines POSITIVE (*)    Tetrahydrocannabinol POSITIVE (*)    All other components within normal limits  D-DIMER, QUANTITATIVE (NOT AT [facility])  I-STAT TROPONIN, ED  I-STAT TROPONIN, ED    EKG EKG Interpretation  Date/Time:  Sunday December 05 2018 15:12:05 EST Ventricular Rate:  113 PR Interval:  112 QRS Duration: 82 QT Interval:  308 QTC Calculation: 422 R Axis:   85 Text Interpretation:  Sinus tachycardia Biatrial enlargement Abnormal ECG Since last tracing rate faster Confirmed by Linwood Dibbles [phone]) on 12/05/2018 3:23:02 PM   Radiology Dg Chest 2 View  Result Date: 12/05/2018 CLINICAL DATA:  Chest pain for 1 week. Dizziness. Weakness. Asthma. EXAM: CHEST - 2 VIEW COMPARISON:  None. FINDINGS: The heart size and mediastinal contours are within normal limits. Pulmonary hyperinflation again noted. Both lungs are clear. The visualized skeletal structures are unremarkable. IMPRESSION: Pulmonary hyperinflation, consistent with  COPD. No active cardiopulmonary disease. Electronically Signed   By: Myles Rosenthal M.D.   On: 12/05/2018 16:04    Procedures Procedures (including critical care time)  Medications Ordered in ED Medications  LORazepam (ATIVAN) injection 1 mg (has no administration in time range)  sodium chloride 0.9 % bolus 1,000 mL (0 mLs Intravenous Stopped 12/05/18 1721)  LORazepam (ATIVAN) injection 0.5 mg (0.5 mg Intravenous Given 12/05/18 1538)     Initial Impression / Assessment and Plan / ED Course  I have reviewed the triage vital signs and the nursing notes.  Pertinent labs & imaging results that were available during my care of the patient were reviewed by me and considered in my medical decision making (see chart for details).  Clinical Course as of Dec 05 1834  Wynelle Link Dec 05, 2018  5231 32 year old  male presents the ER with chest pain x1 week.  Patient reports developing shortness of breath with clammy palms, feeling dizzy, face tingling and feeling like his face was swelling today.  Patient reports marijuana use and denies other illicit drug use.  On exam patient is anxious appearing holding his chest, lung sounds are clear, oxygenation at 100% on room air.  Chest x-ray shows hyperinflation, otherwise normal, troponin x2-, d-dimer negative, BMP and CBC without significant changes, urine drug screen positive for amphetamines and marijuana.  Discussed results with patient, advised to avoid further drug use.  Follow-up with PCP, return to ER for new or worsening symptoms.   [LM]    Clinical Course User Index [LM] Jeannie Fend, PA-C   Final Clinical Impressions(s) / ED Diagnoses   Final diagnoses:  Atypical chest pain  Amphetamine adverse reaction, initial encounter    ED Discharge Orders    None       Alden Hipp 12/05/18 Harl Bowie, MD 12/05/18 [phone]

## 2018-12-05 NOTE — ED Notes (Signed)
Went in to recheck vitals and give urinal so we can get a urine sample. Pt sitting up in bed with girlfriend holding him. Pts girlfriend asked for update on results and stated he is shaking really bad. RN Romeo AppleBen notified.

## 2019-02-10 ENCOUNTER — Emergency Department (HOSPITAL_COMMUNITY)

## 2019-02-10 ENCOUNTER — Encounter (HOSPITAL_COMMUNITY): Payer: Self-pay | Admitting: Emergency Medicine

## 2019-02-10 ENCOUNTER — Emergency Department (HOSPITAL_COMMUNITY)
Admission: EM | Admit: 2019-02-10 | Discharge: 2019-02-10 | Disposition: A | Discharge status: Home / Self Care | Attending: Emergency Medicine | Admitting: Emergency Medicine

## 2019-02-10 DIAGNOSIS — Z87891 Personal history of nicotine dependence: Secondary | ICD-10-CM | POA: Insufficient documentation

## 2019-02-10 DIAGNOSIS — J45909 Unspecified asthma, uncomplicated: Secondary | ICD-10-CM | POA: Diagnosis not present

## 2019-02-10 DIAGNOSIS — J189 Pneumonia, unspecified organism: Secondary | ICD-10-CM

## 2019-02-10 DIAGNOSIS — R05 Cough: Secondary | ICD-10-CM | POA: Diagnosis present

## 2019-02-10 DIAGNOSIS — J181 Lobar pneumonia, unspecified organism: Secondary | ICD-10-CM | POA: Diagnosis not present

## 2019-02-10 LAB — CBC WITH DIFFERENTIAL/PLATELET
Abs Immature Granulocytes: 0.08 10*3/uL — ABNORMAL HIGH (ref 0.00–0.07)
BASOS PCT: 0 %
Basophils Absolute: 0 10*3/uL (ref 0.0–0.1)
Eosinophils Absolute: 0.2 10*3/uL (ref 0.0–0.5)
Eosinophils Relative: 2 %
HCT: 36.9 % — ABNORMAL LOW (ref 39.0–52.0)
Hemoglobin: 12 g/dL — ABNORMAL LOW (ref 13.0–17.0)
Immature Granulocytes: 1 %
Lymphocytes Relative: 12 %
Lymphs Abs: 1.8 10*3/uL (ref 0.7–4.0)
MCH: 29.1 pg (ref 26.0–34.0)
MCHC: 32.5 g/dL (ref 30.0–36.0)
MCV: 89.6 fL (ref 80.0–100.0)
Monocytes Absolute: 1.6 10*3/uL — ABNORMAL HIGH (ref 0.1–1.0)
Monocytes Relative: 11 %
Neutro Abs: 11.6 10*3/uL — ABNORMAL HIGH (ref 1.7–7.7)
Neutrophils Relative %: 74 %
Platelets: 460 10*3/uL — ABNORMAL HIGH (ref 150–400)
RBC: 4.12 MIL/uL — ABNORMAL LOW (ref 4.22–5.81)
RDW: 13.9 % (ref 11.5–15.5)
WBC: 15.3 10*3/uL — ABNORMAL HIGH (ref 4.0–10.5)
nRBC: 0 % (ref 0.0–0.2)

## 2019-02-10 LAB — URINALYSIS, ROUTINE W REFLEX MICROSCOPIC
Bacteria, UA: NONE SEEN
Bilirubin Urine: NEGATIVE
Glucose, UA: NEGATIVE mg/dL
Hgb urine dipstick: NEGATIVE
Ketones, ur: NEGATIVE mg/dL
Nitrite: NEGATIVE
Protein, ur: NEGATIVE mg/dL
Specific Gravity, Urine: 1.017 (ref 1.005–1.030)
pH: 6 (ref 5.0–8.0)

## 2019-02-10 LAB — COMPREHENSIVE METABOLIC PANEL
ALT: 68 U/L — ABNORMAL HIGH (ref 0–44)
AST: 43 U/L — ABNORMAL HIGH (ref 15–41)
Albumin: 3.2 g/dL — ABNORMAL LOW (ref 3.5–5.0)
Alkaline Phosphatase: 210 U/L — ABNORMAL HIGH (ref 38–126)
Anion gap: 9 (ref 5–15)
BUN: 8 mg/dL (ref 6–20)
CO2: 27 mmol/L (ref 22–32)
Calcium: 9.1 mg/dL (ref 8.9–10.3)
Chloride: 100 mmol/L (ref 98–111)
Creatinine, Ser: 0.83 mg/dL (ref 0.61–1.24)
GFR calc Af Amer: >60 mL/min (ref >60)
GFR calc non Af Amer: >60 mL/min (ref >60)
Glucose, Bld: 98 mg/dL (ref 70–99)
POTASSIUM: 4 mmol/L (ref 3.5–5.1)
Sodium: 136 mmol/L (ref 135–145)
Total Bilirubin: 0.6 mg/dL (ref 0.3–1.2)
Total Protein: 8.8 g/dL — ABNORMAL HIGH (ref 6.5–8.1)

## 2019-02-10 LAB — LACTIC ACID, PLASMA: Lactic Acid, Venous: 1.5 mmol/L (ref 0.5–1.9)

## 2019-02-10 MED ORDER — ONDANSETRON 4 MG PO TBDP: 4.0000 mg | ORAL_TABLET | Freq: Three times a day (TID) | ORAL | 0 refills | Status: DC | PRN

## 2019-02-10 MED ORDER — BENZONATATE 100 MG PO CAPS: 100.0000 mg | ORAL_CAPSULE | Freq: Three times a day (TID) | ORAL | 0 refills | Status: DC

## 2019-02-10 MED ORDER — AMOXICILLIN-POT CLAVULANATE 875-125 MG PO TABS: 1.0000 | ORAL_TABLET | Freq: Two times a day (BID) | ORAL | 0 refills | Status: DC

## 2019-02-10 MED ORDER — SODIUM CHLORIDE 0.9% FLUSH: 3.0000 mL | Freq: Once | INTRAVENOUS | Status: DC

## 2019-02-10 NOTE — ED Triage Notes (Signed)
Patient here via [facility] in custody with complaints of RLL pneumonia. Treated for 7 days with 250mg   Of Azithromycin. WBC 20.5 collected 2/10.

## 2019-02-10 NOTE — ED Provider Notes (Signed)
[facility]-EMERGENCY DEPT Provider Note   CSN: [id number] Arrival date & time: 02/10/19  1457     History   Chief Complaint Chief Complaint  Patient presents with  . Abnormal Lab  . Pneumonia    HPI Ryan [name] is a 33 y.o. male who [facility] and congestion. Patient was treated for pneumonia with Zithromax x 7 days. Last dose was Feb. 2nd. Patient sent here due to lab results showing WBC 20.5 done 02/07/19. Patient reports continued body aches, chills, fever and back pain.   The history is provided by the patient. No language interpreter was used.  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 Result type: hematology   Hematology:    Leukocytes:  High   Other abnormal hematology result:  WBC 20.5 Pneumonia  This is a new problem. The current episode started more than 1 week ago. Associated symptoms include headaches (with coughing hard). Pertinent negatives include no abdominal pain.  Cough  Associated symptoms: chills, fever and headaches (with coughing hard)   Associated symptoms: no ear pain, no eye discharge and no rash  Sore throat: with cough.     Past Medical History:  Diagnosis Date  . Anxiety   . Asthma   . Depression     Patient Active Problem List   Diagnosis Date Noted  . Chest pain of uncertain etiology 02/17/2017  . Dizziness of unknown cause 02/17/2017  . Smoker 02/17/2017    History reviewed. No pertinent surgical history.      Home Medications    Prior to Admission medications   Medication Sig Start Date End Date Taking? Authorizing Provider  amoxicillin-clavulanate (AUGMENTIN) 875-125 MG tablet Take 1 tablet by mouth every 12 (twelve) hours. 02/10/19   Janne NapoleonNeese, Salem Mastrogiovanni M, NP  benzonatate (TESSALON) 100 MG capsule Take 1 capsule (100 mg total) by mouth every 8 (eight) hours. 02/10/19   Janne NapoleonNeese, Suzy Kugel M, NP  hydrOXYzine (ATARAX/VISTARIL) 25 MG tablet Take 1  tablet (25 mg total) by mouth every 6 (six) hours as needed for anxiety. 02/11/17   Gilda CreasePollina, Christopher J, MD  meclizine (ANTIVERT) 25 MG tablet Take 1 tablet (25 mg total) by mouth 3 (three) times daily as needed for dizziness. 02/08/17   Bethann BerkshireZammit, Joseph, MD  ondansetron (ZOFRAN ODT) 4 MG disintegrating tablet Take 1 tablet (4 mg total) by mouth every 8 (eight) hours as needed for nausea or vomiting. 02/10/19   Janne NapoleonNeese, Toretto Tingler M, NP    Family History Family History  Family history unknown: Yes    Social History Social History   Tobacco Use  . Smoking status: Former Smoker    Packs/day: 1.00    Years: 5.00    Pack years: 5.00    Types: Cigarettes    Start date: 01/27/2017  . Smokeless tobacco: Never Used  Substance Use Topics  . Alcohol use: No    Alcohol/week: 2.0 standard drinks    Types: 2 Cans of beer per week    Frequency: Never    Comment: quit a few months ago.  . Drug use: Yes    Types: Marijuana     Allergies   Strawberry extract and Pollen extract   Review of Systems Review of Systems  Constitutional: Positive for chills and fever.  HENT: Positive for congestion. Negative for ear pain, facial swelling and sinus pain. Sore throat: with cough.   Eyes: Negative for pain, discharge, redness  and itching.  Respiratory: Positive for cough.   Gastrointestinal: Positive for nausea. Negative for abdominal pain and vomiting.  Genitourinary: Negative for discharge, dysuria and urgency.  Musculoskeletal: Positive for back pain.  Skin: Negative for rash.  Neurological: Positive for headaches (with coughing hard). Negative for syncope.  Psychiatric/Behavioral: Negative for confusion.     Physical Exam Updated Vital Signs BP 120/85   Pulse (!) 106   Temp 98.1 F (36.7 C) (Oral)   Resp 18   Ht 5\' 10"  (1.778 m)   Wt 61.2 kg   SpO2 100%   BMI 19.37 kg/m   Physical Exam Vitals signs and nursing note reviewed.  Constitutional:      General: He is not in acute  distress.    Appearance: He is well-developed.  HENT:     Head: Normocephalic.     Right Ear: Tympanic membrane normal.     Left Ear: Tympanic membrane normal.     Nose: Congestion present.     Mouth/Throat:     Mouth: Mucous membranes are moist.     Pharynx: Oropharynx is clear.  Eyes:     Extraocular Movements: Extraocular movements intact.     Conjunctiva/sclera: Conjunctivae normal.  Neck:     Musculoskeletal: Neck supple.  Cardiovascular:     Rate and Rhythm: Normal rate and regular rhythm.  Pulmonary:     Effort: Pulmonary effort is normal. No respiratory distress.     Breath sounds: Examination of the right-middle field reveals rales. Rales present. No wheezing.  Abdominal:     Palpations: Abdomen is soft.     Tenderness: There is no abdominal tenderness.  Musculoskeletal: Normal range of motion.  Skin:    General: Skin is warm and dry.  Neurological:     General: No focal deficit present.     Mental Status: He is alert and oriented to person, place, and time.  Psychiatric:        Mood and Affect: Mood normal.      ED Treatments / Results  Labs (all labs ordered are listed, but only abnormal results are displayed) Labs Reviewed  COMPREHENSIVE METABOLIC PANEL - Abnormal; Notable for the following components:      Result Value   Total Protein 8.8 (*)    Albumin 3.2 (*)    AST 43 (*)    ALT 68 (*)    Alkaline Phosphatase 210 (*)    All other components within normal limits  CBC WITH DIFFERENTIAL/PLATELET - Abnormal; Notable for the following components:   WBC 15.3 (*)    RBC 4.12 (*)    Hemoglobin 12.0 (*)    HCT 36.9 (*)    Platelets 460 (*)    Neutro Abs 11.6 (*)    Monocytes Absolute 1.6 (*)    Abs Immature Granulocytes 0.08 (*)    All other components within normal limits  URINALYSIS, ROUTINE W REFLEX MICROSCOPIC - Abnormal; Notable for the following components:   Color, Urine AMBER (*)    Leukocytes,Ua TRACE (*)    All other components within  normal limits  LACTIC ACID, PLASMA    Radiology Dg Chest 2 View  Result Date: 02/10/2019 CLINICAL DATA:  Pneumonia EXAM: CHEST - 2 VIEW COMPARISON:  12/05/2018 FINDINGS: The heart size and mediastinal contours are within normal limits. Dense heterogeneous opacity of the right lobe, concerning for infection or aspiration. The visualized skeletal structures are unremarkable. IMPRESSION: Dense heterogeneous opacity of the right lobe, concerning for infection or aspiration. Recommend follow-up  radiographs in 6-8 weeks to ensure complete resolution. Electronically Signed   By: Lauralyn Primes M.D.   On: 02/10/2019 15:28    Procedures Procedures (including critical care time)  Medications Ordered in ED Medications - No data to display  I discussed this case with Dr. Donnald Garre and reviewed the x-ray. Will prescribe Augmentin and Zofran and have patient return in 3 days for recheck or sooner for worsening symptoms. Patient appears safe for d/c.   Initial Impression / Assessment and Plan / ED Course  I have reviewed the triage vital signs and the nursing notes.   Final Clinical Impressions(s) / ED Diagnoses   Final diagnoses:  Pneumonia of right upper lobe due to infectious organism [facility])    ED Discharge Orders         Ordered    amoxicillin-clavulanate (AUGMENTIN) 875-125 MG tablet  Every 12 hours     02/10/19 1707    benzonatate (TESSALON) 100 MG capsule  Every 8 hours     02/10/19 1707    ondansetron (ZOFRAN ODT) 4 MG disintegrating tablet  Every 8 hours PRN     02/10/19 1707           Damian Leavell [place], NP 02/10/19 2259    Arby Barrette, MD 02/11/19 1306

## 2019-02-10 NOTE — Discharge Instructions (Addendum)
Take the medications as directed. Take tylenol or motrin as needed for fever and chills. Return in the next few days for recheck. Return sooner for worsening symptoms.

## 2019-06-15 IMAGING — CR DG CHEST 2V
2 series · 2 of 2 positions shown · non-contrast
Comparison: 12/05/2018

CLINICAL DATA: Pneumonia

EXAM:
CHEST - 2 VIEW

[w chest pa]
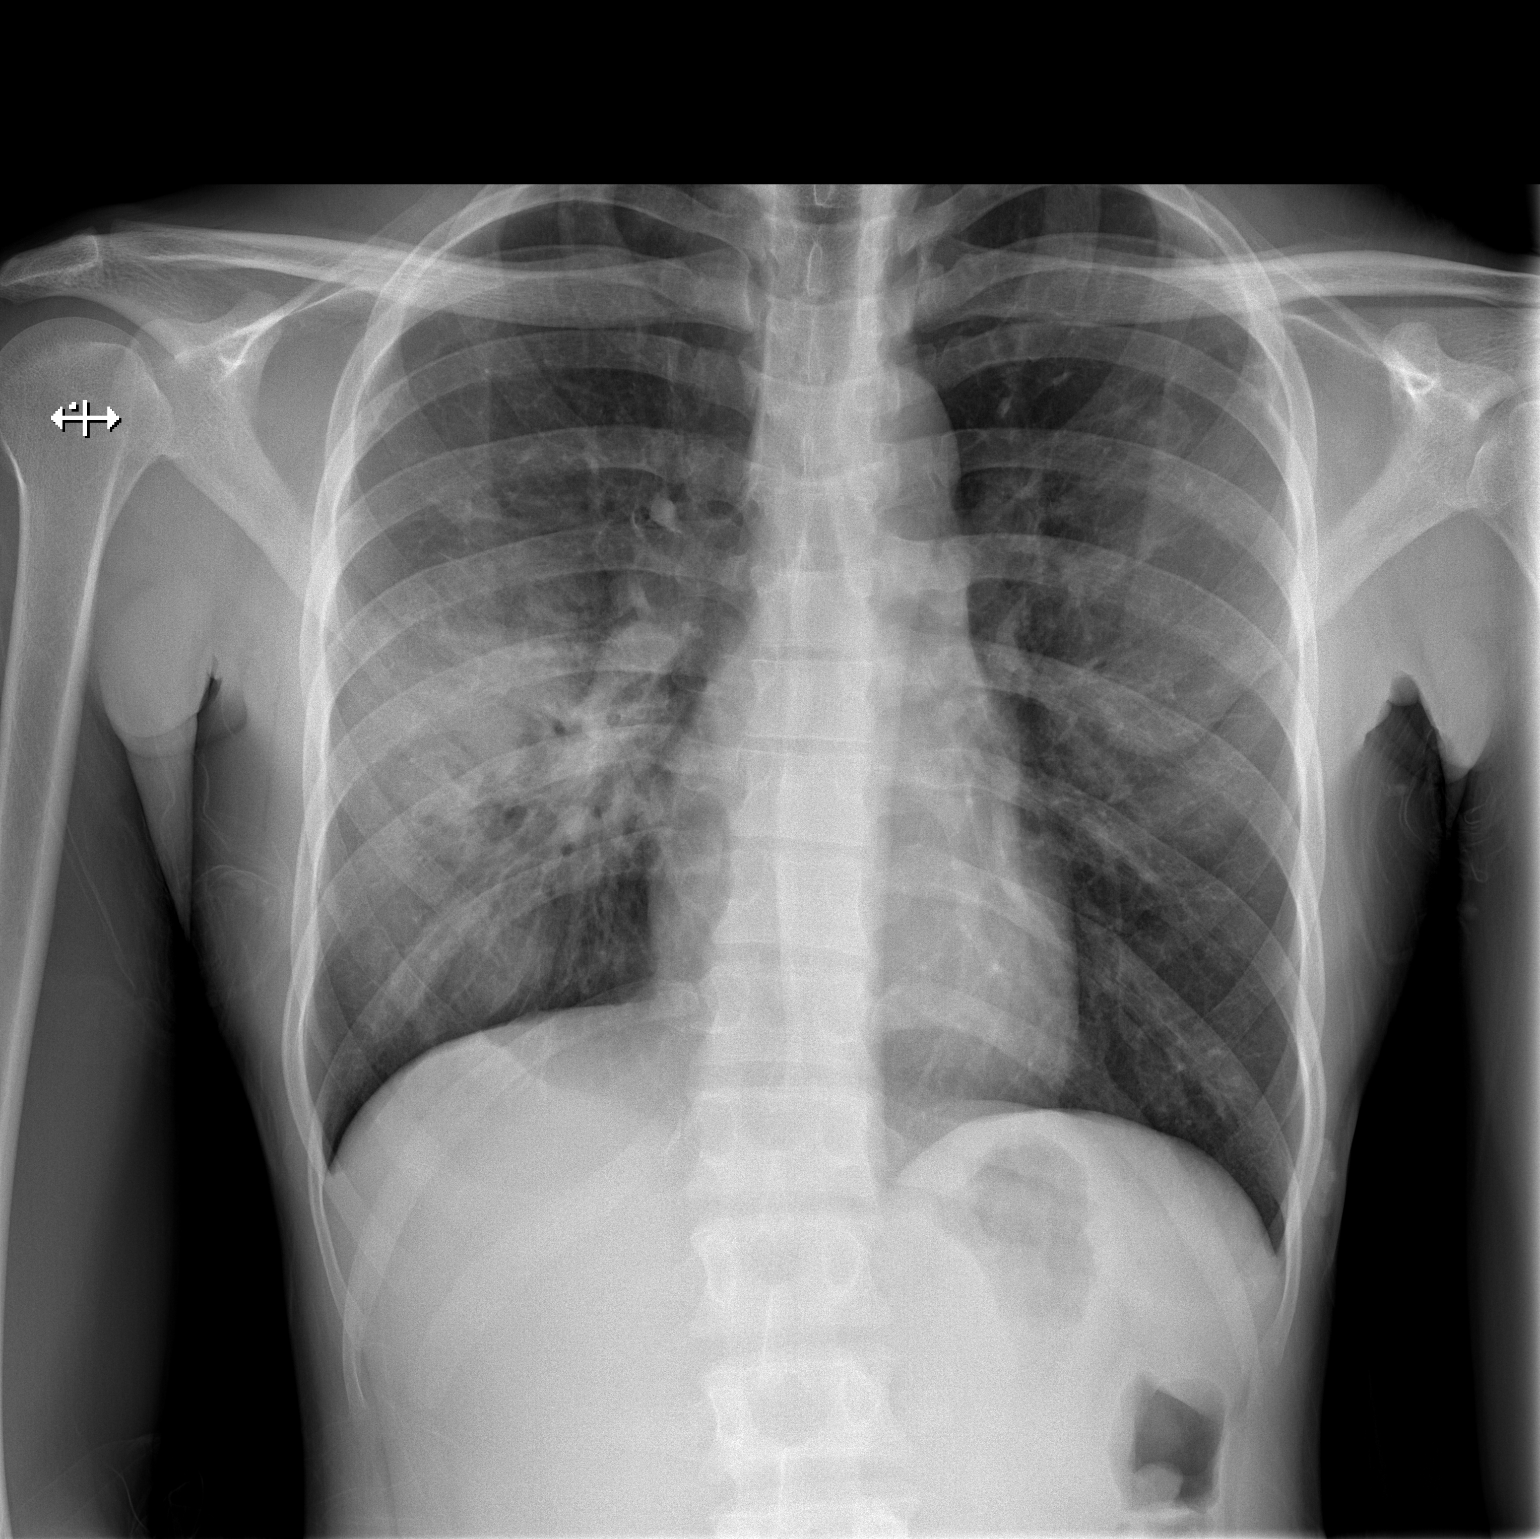

[w chest lat]
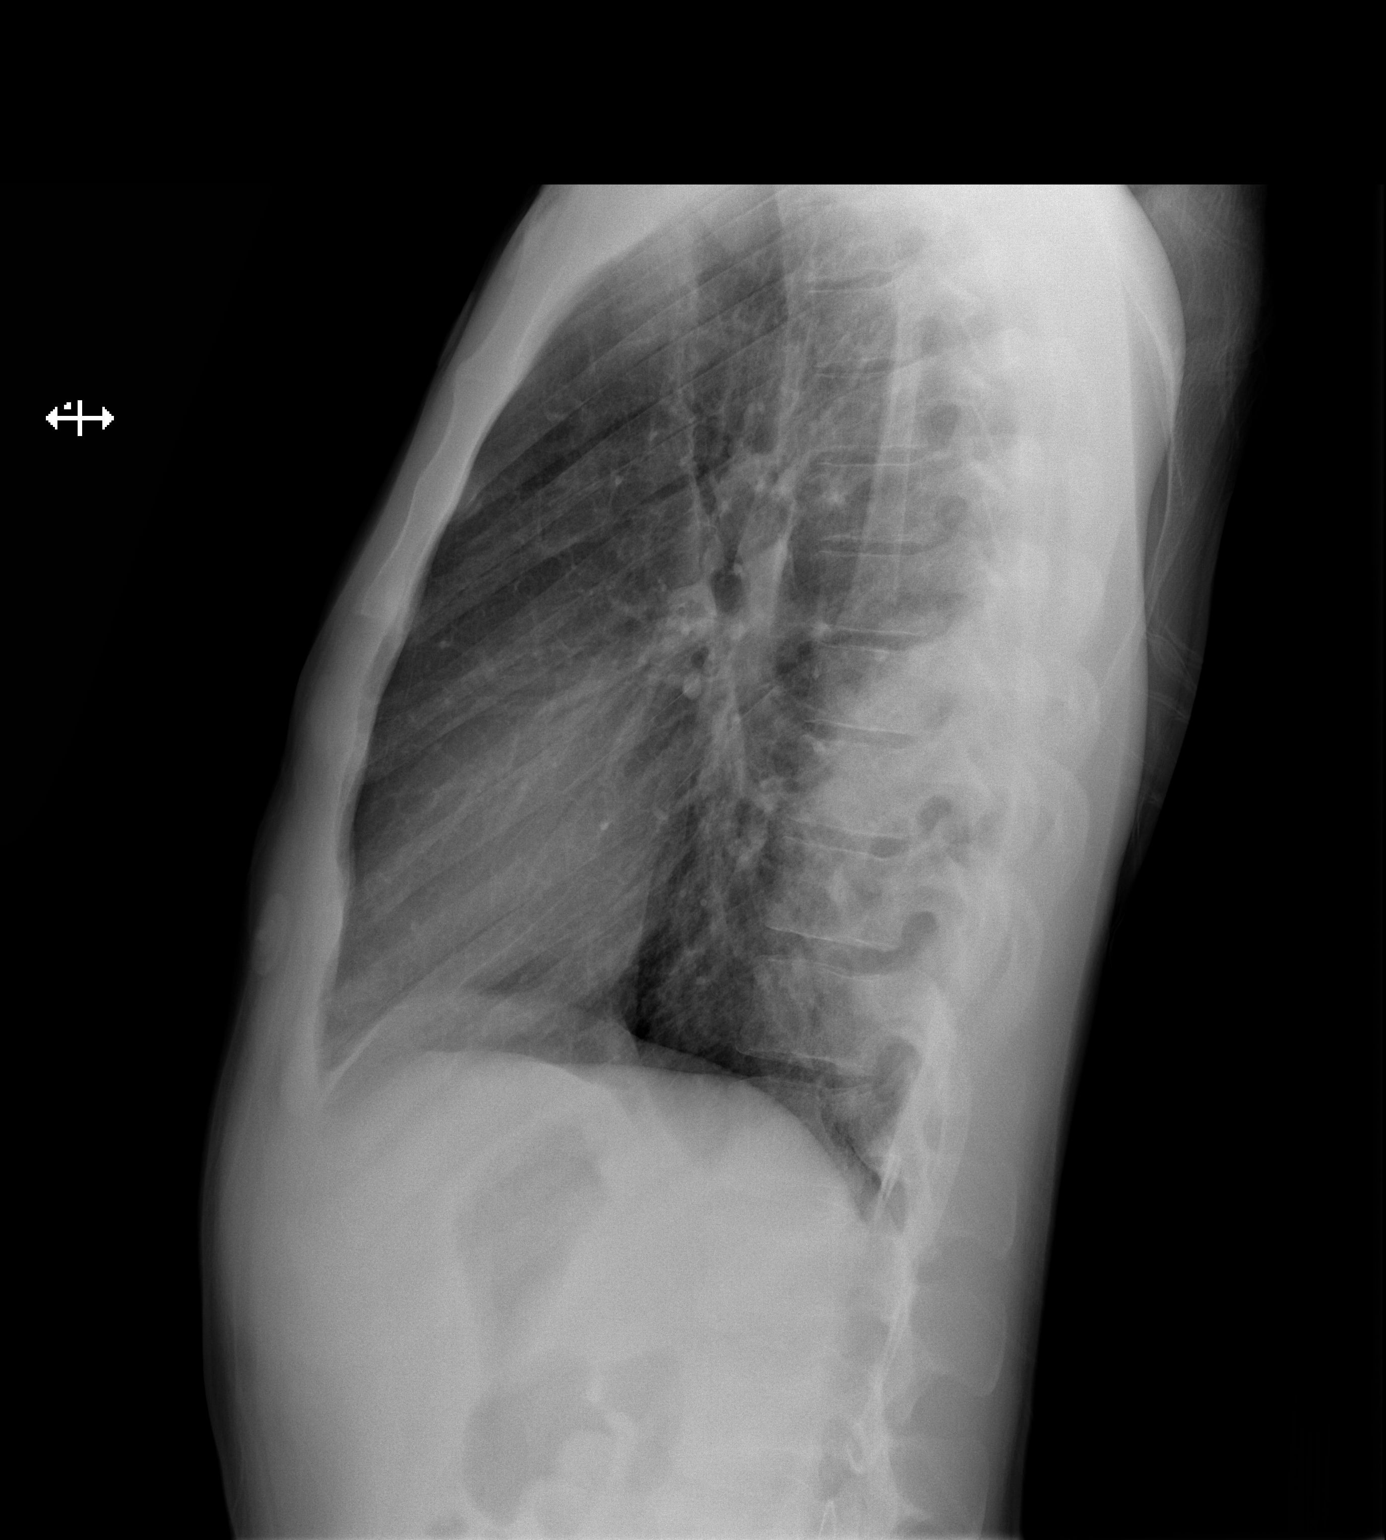

[2 of 2 positions shown; findings below may reference images not displayed]

FINDINGS: The heart size and mediastinal contours are within normal limits.
Dense heterogeneous opacity of the right lobe, concerning for
infection or aspiration. The visualized skeletal structures are
unremarkable.
IMPRESSION: Dense heterogeneous opacity of the right lobe, concerning for
infection or aspiration. Recommend follow-up radiographs in 6-8
weeks to ensure complete resolution.

## 2022-04-04 ENCOUNTER — Ambulatory Visit (HOSPITAL_COMMUNITY)
Admission: EM | Admit: 2022-04-04 | Discharge: 2022-04-04 | Disposition: A | Discharge status: Home / Self Care | Attending: Internal Medicine | Admitting: Internal Medicine

## 2022-04-04 ENCOUNTER — Encounter (HOSPITAL_COMMUNITY): Payer: Self-pay

## 2022-04-04 DIAGNOSIS — Z113 Encounter for screening for infections with a predominantly sexual mode of transmission: Secondary | ICD-10-CM | POA: Insufficient documentation

## 2022-04-04 DIAGNOSIS — R6884 Jaw pain: Secondary | ICD-10-CM

## 2022-04-04 DIAGNOSIS — R1030 Lower abdominal pain, unspecified: Secondary | ICD-10-CM | POA: Insufficient documentation

## 2022-04-04 LAB — POCT URINALYSIS DIPSTICK, ED / UC
Bilirubin Urine: NEGATIVE
Glucose, UA: NEGATIVE mg/dL
Hgb urine dipstick: NEGATIVE
Ketones, ur: NEGATIVE mg/dL
Nitrite: NEGATIVE
Protein, ur: NEGATIVE mg/dL
Specific Gravity, Urine: 1.015 (ref 1.005–1.030)
Urobilinogen, UA: 0.2 mg/dL (ref 0.0–1.0)
pH: 6.5 (ref 5.0–8.0)

## 2022-04-04 MED ORDER — AMOXICILLIN-POT CLAVULANATE 875-125 MG PO TABS: 1.0000 | ORAL_TABLET | Freq: Two times a day (BID) | ORAL | 0 refills | Status: DC

## 2022-04-04 NOTE — ED Provider Notes (Signed)
?[facility] ? ? ? ?CSN: [id number] ?Arrival date & time: 04/04/22  0848 ? ? ?  ? ?History   ?Chief Complaint ?Chief Complaint  ?Patient presents with  ? Abdominal Pain  ? Dizziness  ? ? ?HPI ?Ryan [name] is a 36 y.o. male.  ? ?Patient presents with several different chief complaints today.  Patient presents with lower abdominal pain that started approximately 3 days ago.  He reports that it is "more moist in his penile region" but denies any actual penile discharge, urinary burning, urinary frequency testicular pain, back pain, fever.  Patient does report that it felt like he had to urinate with the abdominal pain.  Denies any known exposure to STD.  Denies any nausea, vomiting, diarrhea, constipation.  Having normal bowel movements with no blood in stool.  Patient also reporting left lower jaw pain that radiates down left neck and into left ear.  Also having associated dizziness with this.  Denies any dental pain.  Patient reports that this has been occurring for "a minute". When asked to elaborate, he states that it started a few months ago when he was in prison.  ? ? ?Abdominal Pain ?Dizziness ? ?Past Medical History:  ?Diagnosis Date  ? Anxiety   ? Asthma   ? Depression   ? ? ?Patient Active Problem List  ? Diagnosis Date Noted  ? Chest pain of uncertain etiology 02/17/2017  ? Dizziness of unknown cause 02/17/2017  ? Smoker 02/17/2017  ? ? ?History reviewed. No pertinent surgical history. ? ? ? ? ?Home Medications   ? ?Prior to Admission medications   ?Medication Sig Start Date End Date Taking? Authorizing Provider  ?amoxicillin-clavulanate (AUGMENTIN) 875-125 MG tablet Take 1 tablet by mouth every 12 (twelve) hours. 04/04/22  Yes Gustavus Bryant, FNP  ? ? ?Family History ?Family History  ?Family history unknown: Yes  ? ? ?Social History ?Social History  ? ?Tobacco Use  ? Smoking status: Some Days  ?  Packs/day: 1.00  ?  Years: 5.00  ?  Pack years: 5.00  ?  Types: Cigarettes  ?  Start date: 01/27/2017   ? Smokeless tobacco: Never  ?Substance Use Topics  ? Alcohol use: No  ?  Alcohol/week: 2.0 standard drinks  ?  Types: 2 Cans of beer per week  ?  Comment: quit a few months ago.  ? Drug use: Yes  ?  Types: Marijuana  ? ? ? ?Allergies   ?Strawberry extract and Pollen extract ? ? ?Review of Systems ?Review of Systems ?Per HPI ? ?Physical Exam ?Triage Vital Signs ?ED Triage Vitals  ?Enc Vitals Group  ?   BP 04/04/22 0945 135/76  ?   Pulse Rate 04/04/22 0945 98  ?   Resp 04/04/22 0945 18  ?   Temp 04/04/22 0945 98 ?F (36.7 ?C)  ?   Temp Source 04/04/22 0945 Oral  ?   SpO2 04/04/22 0945 98 %  ?   Weight --   ?   Height --   ?   Head Circumference --   ?   Peak Flow --   ?   Pain Score 04/04/22 0946 0  ?   Pain Loc --   ?   Pain Edu? --   ?   Excl. in GC? --   ? ?No data found. ? ?Updated Vital Signs ?BP 135/76 (BP Location: Right Arm)   Pulse 98   Temp 98 ?F (36.7 ?C) (Oral)   Resp 18  SpO2 98%  ? ?Visual Acuity ?Right Eye Distance:   ?Left Eye Distance:   ?Bilateral Distance:   ? ?Right Eye Near:   ?Left Eye Near:    ?Bilateral Near:    ? ?Physical Exam ?Constitutional:   ?   General: He is not in acute distress. ?   Appearance: Normal appearance. He is not toxic-appearing or diaphoretic.  ?HENT:  ?   Head: Normocephalic and atraumatic.  ?   Right Ear: Tympanic membrane, ear canal and external ear normal.  ?   Left Ear: Tympanic membrane, ear canal and external ear normal. No drainage, swelling or tenderness.  No middle ear effusion. No foreign body. No mastoid tenderness. Tympanic membrane is not perforated or erythematous.  ?   Mouth/Throat:  ?   Comments: No obvious dental abscess, dental swelling, dental infection.  No tenderness to palpation to inside of mouth or outer jaw. ?Eyes:  ?   Extraocular Movements: Extraocular movements intact.  ?   Conjunctiva/sclera: Conjunctivae normal.  ?   Pupils: Pupils are equal, round, and reactive to light.  ?Cardiovascular:  ?   Rate and Rhythm: Normal rate and regular  rhythm.  ?   Pulses: Normal pulses.  ?   Heart sounds: Normal heart sounds.  ?Pulmonary:  ?   Effort: Pulmonary effort is normal. No respiratory distress.  ?   Breath sounds: Normal breath sounds.  ?Abdominal:  ?   General: Abdomen is flat. Bowel sounds are normal. There is no distension.  ?   Palpations: Abdomen is soft.  ?   Tenderness: There is no abdominal tenderness.  ?Genitourinary: ?   Comments: Deferred with shared decision making.  Self swab performed. ?Lymphadenopathy:  ?   Cervical: No cervical adenopathy.  ?Neurological:  ?   General: No focal deficit present.  ?   Mental Status: He is alert and oriented to person, place, and time. Mental status is at baseline.  ?Psychiatric:     ?   Mood and Affect: Mood normal.     ?   Behavior: Behavior normal.     ?   Thought Content: Thought content normal.     ?   Judgment: Judgment normal.  ? ? ? ?UC Treatments / Results  ?Labs ?(all labs ordered are listed, but only abnormal results are displayed) ?Labs Reviewed  ?POCT URINALYSIS DIPSTICK, ED / UC - Abnormal; Notable for the following components:  ?    Result Value  ? Leukocytes,Ua TRACE (*)   ? All other components within normal limits  ?URINE CULTURE  ?CYTOLOGY, (ORAL, ANAL, URETHRAL) ANCILLARY ONLY  ? ? ?EKG ? ? ?Radiology ?No results found. ? ?Procedures ?Procedures (including critical care time) ? ?Medications Ordered in UC ?Medications - No data to display ? ?Initial Impression / Assessment and Plan / UC Course  ?I have reviewed the triage vital signs and the nursing notes. ? ?Pertinent labs & imaging results that were available during my care of the patient were reviewed by me and considered in my medical decision making (see chart for details). ? ?  ? ?There are no obvious abnormalities on physical exam that could explain patient's left jaw/ear pain.  No obvious ear infections, cervical lymphadenopathy, dental infection.  Neuro exam and vitals are normal.There is a possibility that patient could have a  deep dental infection causing jaw pain so will opt to treat with Augmentin antibiotic. ? ?Urinalysis completed for abdominal pain that shows trace leukocytes but is not definitive for urinary  tract infection.  There is a high suspicion for possible STD due to patient's symptoms and physical exam.  Cytology swab pending as well as urine culture.  Will await results for any further treatment.  Patient advised to refrain from sexual activity until test results and treatment are complete. ? ?Discussed strict return and ER precautions for all of patient's symptoms.  Patient verbalized understanding and was agreeable with plan. ?Final Clinical Impressions(s) / UC Diagnoses  ? ?Final diagnoses:  ?Jaw pain  ?Lower abdominal pain  ?Screening examination for venereal disease  ? ? ? ?Discharge Instructions   ? ?  ?You are being prescribed an antibiotic due to possible dental infection.  Please follow-up with a dentist.  Go to the hospital if symptoms persist or worsen.  Your urine culture and STD swab is also pending.  Please refrain from sexual activity until test results and treatment are complete. ? ? ? ?ED Prescriptions   ? ? Medication Sig Dispense Auth. Provider  ? amoxicillin-clavulanate (AUGMENTIN) 875-125 MG tablet Take 1 tablet by mouth every 12 (twelve) hours. 14 tablet Ervin KnackMound, Dustan Hyams E, [place]NP  ? ?  ? ?PDMP not reviewed this encounter. ?  ?Gustavus BryantMound, Niaja Stickley E, [place]NP ?04/04/22 1050 ? ?

## 2022-04-04 NOTE — Discharge Instructions (Addendum)
You are being prescribed an antibiotic due to possible dental infection.  Please follow-up with a dentist.  Go to the hospital if symptoms persist or worsen.  Your urine culture and STD swab is also pending.  Please refrain from sexual activity until test results and treatment are complete. ?

## 2022-04-04 NOTE — ED Triage Notes (Signed)
Pt c/o lower abdominal/pelvic pain with tender to touch x3 days. States last normal BM yesterday. ? ?Pt c/o dizziness x1 wk. States having pressure to lt ear.  ?

## 2022-04-07 LAB — CYTOLOGY, (ORAL, ANAL, URETHRAL) ANCILLARY ONLY
Chlamydia: POSITIVE — AB
Comment: NEGATIVE
Comment: NEGATIVE
Comment: NORMAL
Neisseria Gonorrhea: POSITIVE — AB
Trichomonas: POSITIVE — AB

## 2022-04-07 LAB — URINE CULTURE: Culture: <10000 — AB

## 2022-04-08 ENCOUNTER — Telehealth (HOSPITAL_COMMUNITY): Payer: Self-pay | Admitting: Emergency Medicine

## 2022-04-08 MED ORDER — DOXYCYCLINE HYCLATE 100 MG PO CAPS: 100.0000 mg | ORAL_CAPSULE | Freq: Two times a day (BID) | ORAL | 0 refills | Status: AC

## 2022-04-08 MED ORDER — METRONIDAZOLE 500 MG PO TABS: 2000.0000 mg | ORAL_TABLET | Freq: Once | ORAL | 0 refills | Status: AC

## 2022-05-08 ENCOUNTER — Emergency Department (HOSPITAL_COMMUNITY)
Admission: EM | Admit: 2022-05-08 | Discharge: 2022-05-09 | Discharge status: Against Medical Advice | Payer: 59 | Attending: Emergency Medicine | Admitting: Emergency Medicine

## 2022-05-08 ENCOUNTER — Other Ambulatory Visit: Payer: Self-pay

## 2022-05-08 ENCOUNTER — Encounter (HOSPITAL_COMMUNITY): Payer: Self-pay | Admitting: Emergency Medicine

## 2022-05-08 DIAGNOSIS — Z5321 Procedure and treatment not carried out due to patient leaving prior to being seen by health care provider: Secondary | ICD-10-CM | POA: Insufficient documentation

## 2022-05-08 DIAGNOSIS — R0602 Shortness of breath: Secondary | ICD-10-CM | POA: Diagnosis not present

## 2022-05-08 DIAGNOSIS — J45909 Unspecified asthma, uncomplicated: Secondary | ICD-10-CM | POA: Insufficient documentation

## 2022-05-08 DIAGNOSIS — R079 Chest pain, unspecified: Secondary | ICD-10-CM | POA: Insufficient documentation

## 2022-05-08 DIAGNOSIS — F17210 Nicotine dependence, cigarettes, uncomplicated: Secondary | ICD-10-CM | POA: Diagnosis not present

## 2022-05-08 DIAGNOSIS — R42 Dizziness and giddiness: Secondary | ICD-10-CM | POA: Diagnosis not present

## 2022-05-08 NOTE — ED Triage Notes (Signed)
CP x 2 months intermittent. ?L chest, radiates into L neck, sharp ?Endorses lightheadedness during events.  ?Smokes tobacco, worse when he "chain smokes" ?Denies diaphoresis, N/V. ?H/o asthma, lost his rescue inhaler ? ?

## 2022-05-09 ENCOUNTER — Emergency Department (HOSPITAL_COMMUNITY): Payer: 59

## 2022-05-09 LAB — BASIC METABOLIC PANEL
Anion gap: 7 (ref 5–15)
BUN: 10 mg/dL (ref 6–20)
CO2: 25 mmol/L (ref 22–32)
Calcium: 9.4 mg/dL (ref 8.9–10.3)
Chloride: 107 mmol/L (ref 98–111)
Creatinine, Ser: 1.14 mg/dL (ref 0.61–1.24)
GFR, Estimated: >60 mL/min (ref >60)
Glucose, Bld: 128 mg/dL — ABNORMAL HIGH (ref 70–99)
Potassium: 4.2 mmol/L (ref 3.5–5.1)
Sodium: 139 mmol/L (ref 135–145)

## 2022-05-09 LAB — CBC WITH DIFFERENTIAL/PLATELET
Abs Immature Granulocytes: 0.08 10*3/uL — ABNORMAL HIGH (ref 0.00–0.07)
Basophils Absolute: 0.1 10*3/uL (ref 0.0–0.1)
Basophils Relative: 1 %
Eosinophils Absolute: 0.2 10*3/uL (ref 0.0–0.5)
Eosinophils Relative: 3 %
HCT: 43.9 % (ref 39.0–52.0)
Hemoglobin: 15.7 g/dL (ref 13.0–17.0)
Immature Granulocytes: 1 %
Lymphocytes Relative: 29 %
Lymphs Abs: 2.7 10*3/uL (ref 0.7–4.0)
MCH: 32.3 pg (ref 26.0–34.0)
MCHC: 35.8 g/dL (ref 30.0–36.0)
MCV: 90.3 fL (ref 80.0–100.0)
Monocytes Absolute: 0.7 10*3/uL (ref 0.1–1.0)
Monocytes Relative: 8 %
Neutro Abs: 5.6 10*3/uL (ref 1.7–7.7)
Neutrophils Relative %: 58 %
Platelets: 257 10*3/uL (ref 150–400)
RBC: 4.86 MIL/uL (ref 4.22–5.81)
RDW: 13.2 % (ref 11.5–15.5)
WBC: 9.3 10*3/uL (ref 4.0–10.5)
nRBC: 0 % (ref 0.0–0.2)

## 2022-05-09 LAB — TROPONIN I (HIGH SENSITIVITY): Troponin I (High Sensitivity): 3 ng/L (ref <18)

## 2022-05-09 NOTE — ED Provider Triage Note (Signed)
Emergency Medicine Provider Triage Evaluation Note ? ?Ryan [name] , a 36 y.o. male  was evaluated in triage.  Pt complains of chest pain.  3 months of intermittent chest pain, worse with smoking cigarettes.  Sometimes radiates into the left side of his neck and is intermittently associated with shortness of breath.  Reports he had a worsening episode tonight prompting his visit.  No history of heart problems ? ?Review of Systems  ?Positive: Chest pain, shortness of breath ?Negative: Fever, vomiting, syncope ? ?Physical Exam  ?BP 138/90   Pulse 95   Temp 98.2 ?F (36.8 ?C) (Oral)   Resp 18   Wt 68 kg   SpO2 100%   BMI 21.52 kg/m?  ?Gen:   Awake, no distress   ?Resp:  Normal effort  ?MSK:   Moves extremities without difficulty  ?Other:   ? ?Medical Decision Making  ?Medically screening exam initiated at 12:01 AM.  Appropriate orders placed.  Ryan [name] was informed that the remainder of the evaluation will be completed by another provider, this initial triage assessment does not replace that evaluation, and the importance of remaining in the ED until their evaluation is complete. ? ? ?  ?Ryan Lodge, PA-C ?05/09/22 0010 ? ?

## 2022-05-09 NOTE — ED Notes (Signed)
When called pt for bloodwork, found that pt was not in lobby. Called x3. ?

## 2022-09-04 ENCOUNTER — Emergency Department (HOSPITAL_COMMUNITY)
Admission: EM | Admit: 2022-09-04 | Discharge: 2022-09-04 | Disposition: A | Discharge status: Home / Self Care | Payer: Commercial Managed Care - HMO | Attending: Emergency Medicine | Admitting: Emergency Medicine

## 2022-09-04 ENCOUNTER — Encounter (HOSPITAL_COMMUNITY): Payer: Self-pay | Admitting: Emergency Medicine

## 2022-09-04 ENCOUNTER — Emergency Department (HOSPITAL_COMMUNITY): Payer: Commercial Managed Care - HMO

## 2022-09-04 DIAGNOSIS — K219 Gastro-esophageal reflux disease without esophagitis: Secondary | ICD-10-CM

## 2022-09-04 DIAGNOSIS — J45909 Unspecified asthma, uncomplicated: Secondary | ICD-10-CM | POA: Insufficient documentation

## 2022-09-04 DIAGNOSIS — R079 Chest pain, unspecified: Secondary | ICD-10-CM | POA: Insufficient documentation

## 2022-09-04 LAB — D-DIMER, QUANTITATIVE: D-Dimer, Quant: <0.27 ug/mL-FEU (ref 0.00–0.50)

## 2022-09-04 LAB — BASIC METABOLIC PANEL
Anion gap: 10 (ref 5–15)
BUN: 15 mg/dL (ref 6–20)
CO2: 26 mmol/L (ref 22–32)
Calcium: 9.8 mg/dL (ref 8.9–10.3)
Chloride: 101 mmol/L (ref 98–111)
Creatinine, Ser: 1.07 mg/dL (ref 0.61–1.24)
GFR, Estimated: >60 mL/min (ref >60)
Glucose, Bld: 102 mg/dL — ABNORMAL HIGH (ref 70–99)
Potassium: 4 mmol/L (ref 3.5–5.1)
Sodium: 137 mmol/L (ref 135–145)

## 2022-09-04 LAB — CBC
HCT: 45.1 % (ref 39.0–52.0)
Hemoglobin: 15.7 g/dL (ref 13.0–17.0)
MCH: 31.7 pg (ref 26.0–34.0)
MCHC: 34.8 g/dL (ref 30.0–36.0)
MCV: 90.9 fL (ref 80.0–100.0)
Platelets: 275 10*3/uL (ref 150–400)
RBC: 4.96 MIL/uL (ref 4.22–5.81)
RDW: 12.7 % (ref 11.5–15.5)
WBC: 11.4 10*3/uL — ABNORMAL HIGH (ref 4.0–10.5)
nRBC: 0 % (ref 0.0–0.2)

## 2022-09-04 LAB — TROPONIN I (HIGH SENSITIVITY)
Troponin I (High Sensitivity): 3 ng/L (ref <18)
Troponin I (High Sensitivity): 3 ng/L (ref <18)

## 2022-09-04 MED ORDER — ALUM & MAG HYDROXIDE-SIMETH 200-200-20 MG/5ML PO SUSP
30.0000 mL | Freq: Once | ORAL | Status: AC
  Administered 2022-09-04: 30 mL via ORAL
  Filled 2022-09-04: qty 30

## 2022-09-04 MED ORDER — OMEPRAZOLE 20 MG PO CPDR
20.0000 mg | DELAYED_RELEASE_CAPSULE | Freq: Two times a day (BID) | ORAL | 0 refills | Status: DC
Start: 2022-09-04 — End: 2022-11-19

## 2022-09-04 MED ORDER — KETOROLAC TROMETHAMINE 15 MG/ML IJ SOLN: 15.0000 mg | Freq: Once | INTRAMUSCULAR | Status: DC

## 2022-09-04 MED ORDER — FAMOTIDINE 40 MG/5ML PO SUSR: 20.0000 mg | Freq: Every day | ORAL | Status: DC

## 2022-09-04 MED ORDER — FAMOTIDINE 20 MG PO TABS: 20.0000 mg | ORAL_TABLET | Freq: Two times a day (BID) | ORAL | 0 refills | Status: DC | PRN

## 2022-09-04 MED ORDER — ALBUTEROL SULFATE HFA 108 (90 BASE) MCG/ACT IN AERS
2.0000 | INHALATION_SPRAY | Freq: Once | RESPIRATORY_TRACT | Status: AC
  Administered 2022-09-04: 2 via RESPIRATORY_TRACT
  Filled 2022-09-04: qty 6.7

## 2022-09-04 MED ORDER — FAMOTIDINE 40 MG/5ML PO SUSR
20.0000 mg | Freq: Every day | ORAL | Status: DC
  Administered 2022-09-04: 20 mg via ORAL
  Filled 2022-09-04: qty 2.5

## 2022-09-04 NOTE — ED Triage Notes (Signed)
Patient c/o L chest pain x2 months. Reports pain worsening the last week. States constant dull pain with intermittent sharp pain.

## 2022-09-04 NOTE — ED Notes (Signed)
Tried to get blood but was unsuccessful 

## 2022-09-04 NOTE — ED Provider Triage Note (Signed)
Emergency Medicine Provider Triage Evaluation Note  Ryan [name] , a 36 y.o. male  was evaluated in triage.  Pt complains of left-sided chest pain/pressure.  Which has been intermittent since at least 5 or 6 months ago.  Patient states that over the past 2 weeks it is gotten worse.  He states that at 1 point he quit smoking which improved some of symptoms but he has resumed smoking at this point.  He denies shortness of breath.  He is also endorsing a headache which is been ongoing for 2 to 3 days and endorsing left-sided arm pain which has been intermittent with the chest pain.  Patient does have history of asthma, anxiety, depression  Review of Systems  Positive: As above Negative: As above  Physical Exam  BP (!) 154/88   Pulse 98   Temp 97.7 F (36.5 C) (Oral)   Resp (!) 28   SpO2 100%  Gen:   Awake, no distress   Resp:  Normal effort  MSK:   Moves extremities without difficulty  Other:    Medical Decision Making  Medically screening exam initiated at 2:50 PM.  Appropriate orders placed.  Ryan [name] was informed that the remainder of the evaluation will be completed by another provider, this initial triage assessment does not replace that evaluation, and the importance of remaining in the ED until their evaluation is complete.     Darrick Grinder, PA-C 09/04/22 1456

## 2022-09-04 NOTE — ED Provider Notes (Signed)
[facility]-EMERGENCY DEPT Provider Note   CSN: [id number] Arrival date & time: 09/04/22  1417     History  Chief Complaint  Patient presents with   Chest Pain    Ryan [name] is a 36 y.o. male.  Pt is a 36 yo male presenting for chest pain. Pt admits to left sided chest pain with radiation to the left upper extremity, intermittently, described as chest pressure, with associated with sweating, and panicking due to nervousness about the chest pain, x 1 month. Denies sob or dib. Denies fevers, chills, or coughing. Hx of asthma. No home inhalers. No wheezing.   The history is provided by the patient. No language interpreter was used.  Chest Pain Associated symptoms: no abdominal pain, no back pain, no cough, no fever, no palpitations, no shortness of breath and no vomiting        Home Medications Prior to Admission medications   Not on File      Allergies    Strawberry extract and Pollen extract    Review of Systems   Review of Systems  Constitutional:  Negative for chills and fever.  HENT:  Negative for ear pain and sore throat.   Eyes:  Negative for pain and visual disturbance.  Respiratory:  Negative for cough and shortness of breath.   Cardiovascular:  Positive for chest pain. Negative for palpitations.  Gastrointestinal:  Negative for abdominal pain and vomiting.  Genitourinary:  Negative for dysuria and hematuria.  Musculoskeletal:  Negative for arthralgias and back pain.  Skin:  Negative for color change and rash.  Neurological:  Negative for seizures and syncope.  All other systems reviewed and are negative.   Physical Exam Updated Vital Signs BP (!) 141/119   Pulse 89   Temp 98.4 F (36.9 C) (Oral)   Resp (!) 27   Ht 5\' 10"  (1.778 m)   Wt 68 kg   SpO2 97%   BMI 21.52 kg/m  Physical Exam Vitals and nursing note reviewed.  Constitutional:      General: He is not in acute distress.    Appearance: He is well-developed.  HENT:      Head: Normocephalic and atraumatic.  Eyes:     Conjunctiva/sclera: Conjunctivae normal.  Cardiovascular:     Rate and Rhythm: Normal rate and regular rhythm.     Heart sounds: No murmur heard. Pulmonary:     Effort: Pulmonary effort is normal. No respiratory distress.     Breath sounds: Normal breath sounds.  Abdominal:     Palpations: Abdomen is soft.     Tenderness: There is no abdominal tenderness.  Musculoskeletal:        General: No swelling.     Cervical back: Neck supple.  Skin:    General: Skin is warm and dry.     Capillary Refill: Capillary refill takes less than 2 seconds.  Neurological:     Mental Status: He is alert.  Psychiatric:        Mood and Affect: Mood normal.     ED Results / Procedures / Treatments   Labs (all labs ordered are listed, but only abnormal results are displayed) Labs Reviewed  BASIC METABOLIC PANEL - Abnormal; Notable for the following components:      Result Value   Glucose, Bld 102 (*)    All other components within normal limits  CBC - Abnormal; Notable for the following components:   WBC 11.4 (*)    All other components within  normal limits  D-DIMER, QUANTITATIVE  TROPONIN I (HIGH SENSITIVITY)  TROPONIN I (HIGH SENSITIVITY)    EKG EKG Interpretation  Date/Time:  Thursday September 04 2022 14:29:54 EDT Ventricular Rate:  104 PR Interval:  103 QRS Duration: 87 QT Interval:  326 QTC Calculation: 429 R Axis:   94 Text Interpretation: Sinus tachycardia Consider right atrial enlargement Consider right ventricular hypertrophy Confirmed by Edwin Dada ([id number]) on 09/04/2022 7:25:53 PM  Radiology DG Chest 2 View  Result Date: 09/04/2022 CLINICAL DATA:  Left chest pain for 2 months worsening in the past week. EXAM: CHEST - 2 VIEW COMPARISON:  Chest radiograph 05/09/2022 and earlier FINDINGS: The heart size and mediastinal contours are within normal limits. No focal consolidation, pleural effusion, or pneumothorax. The previously  noted 17 mm nodule in the upper right lung is decreased in size, measuring approximately 8 mm on today's exam. The visualized skeletal structures are unremarkable. IMPRESSION: No acute cardiopulmonary abnormality. The previously noted nodule in the right upper lobe is decreased in size compared to 05/09/2022, consistent with benignity and likely infectious versus inflammatory in etiology. Electronically Signed   By: Sherron Ales M.D.   On: 09/04/2022 15:01    Procedures Procedures    Medications Ordered in ED Medications  ketorolac (TORADOL) 15 MG/ML injection 15 mg (15 mg Intravenous Patient Refused/Not Given 09/04/22 2108)  albuterol (VENTOLIN HFA) 108 (90 Base) MCG/ACT inhaler 2 puff (2 puffs Inhalation Given 09/04/22 2113)    ED Course/ Medical Decision Making/ A&P                           Medical Decision Making Amount and/or Complexity of Data Reviewed Labs: ordered. Radiology: ordered.  Risk Prescription drug management.   10:44 PM  36 yo male presenting for chest pain.   The patient's chest pain is not suggestive of pulmonary embolus, cardiac ischemia, aortic dissection, pericarditis, myocarditis, pulmonary embolism, pneumothorax, pneumonia, Zoster, or esophageal perforation, or other serious etiology.  Historically not abrupt in onset, tearing or ripping, pulses symmetric. EKG nonspecific for ischemia/infarction. No dysrhythmias, brugada, WPW, prolonged QT noted. [CXR reviewed and WNL] Troponin negative.. CXR reviewed.  D-dimer negative..  Low suspicion PE.  Labs without demonstration of acute pathology unless otherwise noted above.   Low HEART Score: 0-3 points (0.9-1.7% risk of MACE).] Given the extremely low risk of these diagnoses further testing and evaluation for these possibilities does not appear to be indicated at this time.   Patient in no distress and overall condition improved here in the ED. Detailed discussions were had with the patient regarding current findings,  and need for close f/u with PCP or on call doctor. The patient has been instructed to return immediately if the symptoms worsen in any way for re-evaluation. Patient verbalized understanding and is in agreement with current care plan. All questions answered prior to discharge.         Final Clinical Impression(s) / ED Diagnoses Final diagnoses:  Chest pain, unspecified type    Rx / DC Orders ED Discharge Orders     None         Franne Forts, DO 09/04/22 2244

## 2022-09-04 NOTE — ED Notes (Signed)
Patient made aware of plan of care 

## 2022-09-04 NOTE — Discharge Instructions (Addendum)
Pepcid and omeprazole sent to your pharmacy.  Take as prescribed.

## 2022-09-11 IMAGING — CR DG CHEST 2V
2 series · 2 of 2 positions shown · non-contrast
Comparison: 02/10/2019

CLINICAL DATA: Chest pain

EXAM:
CHEST - 2 VIEW

[chest pa]
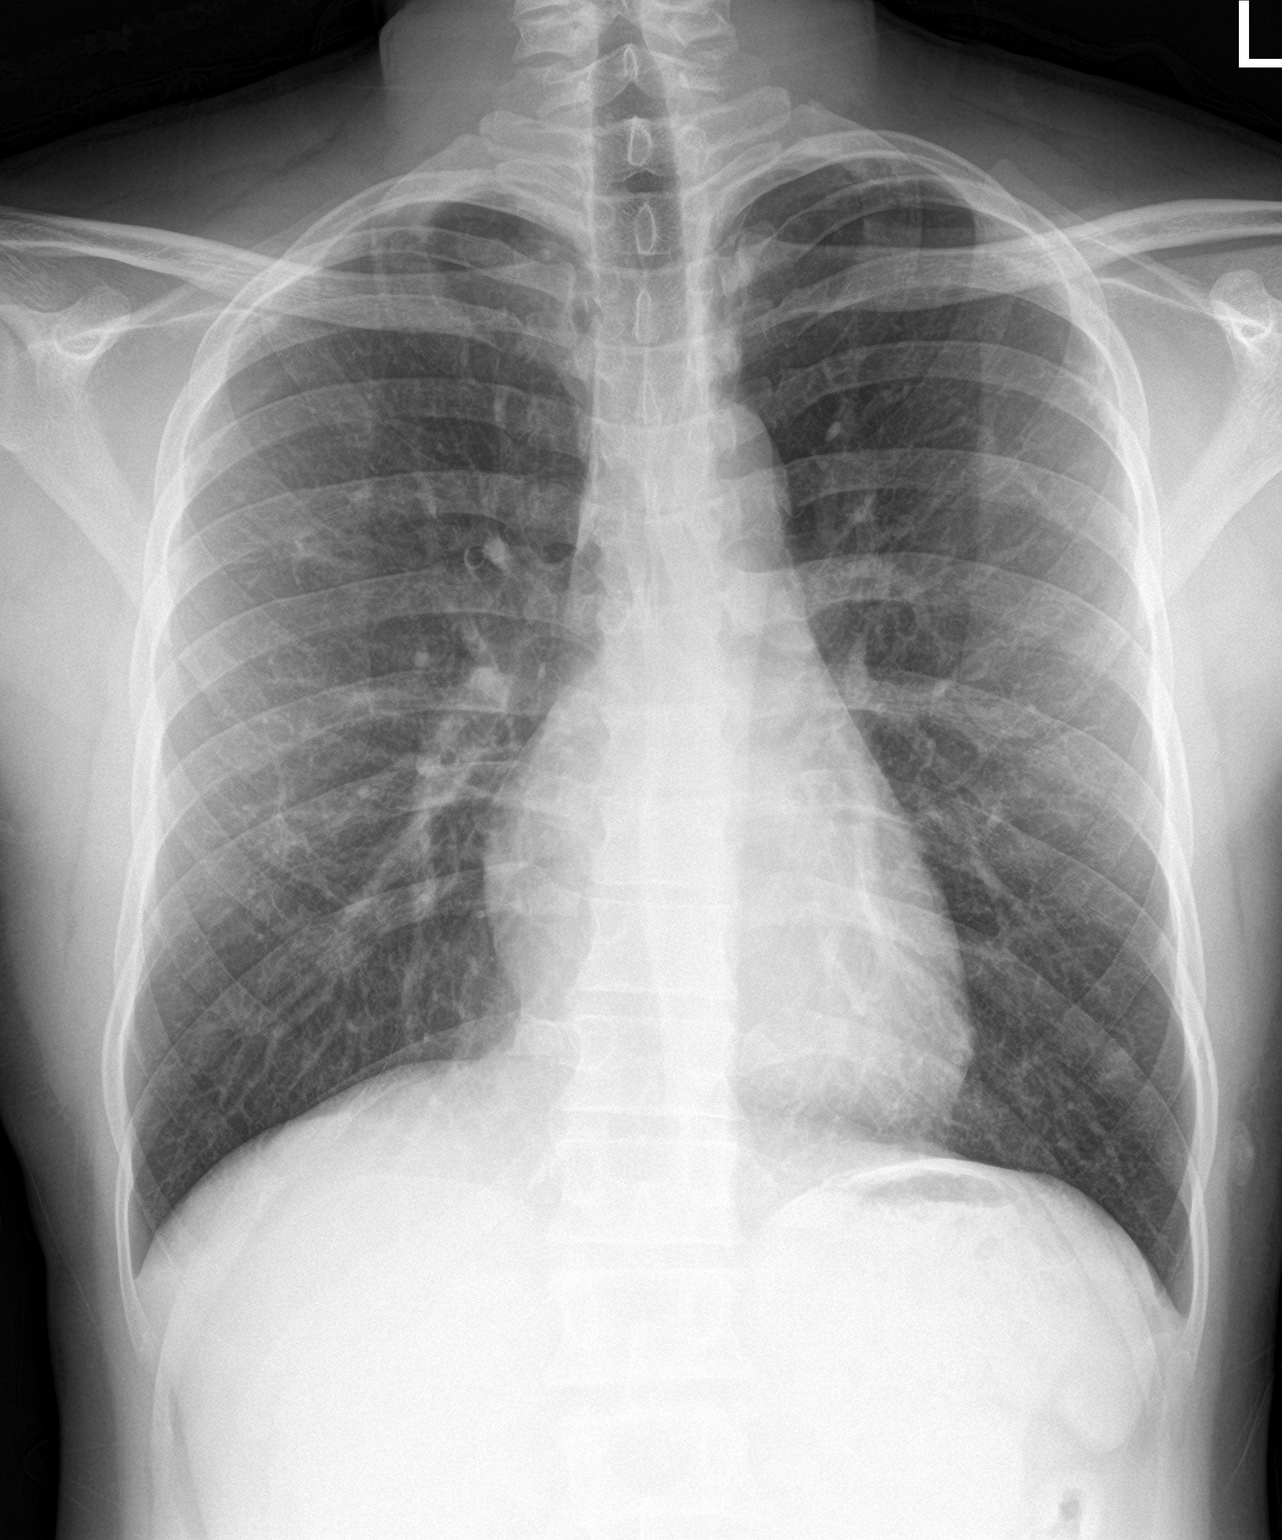

[chest lat]
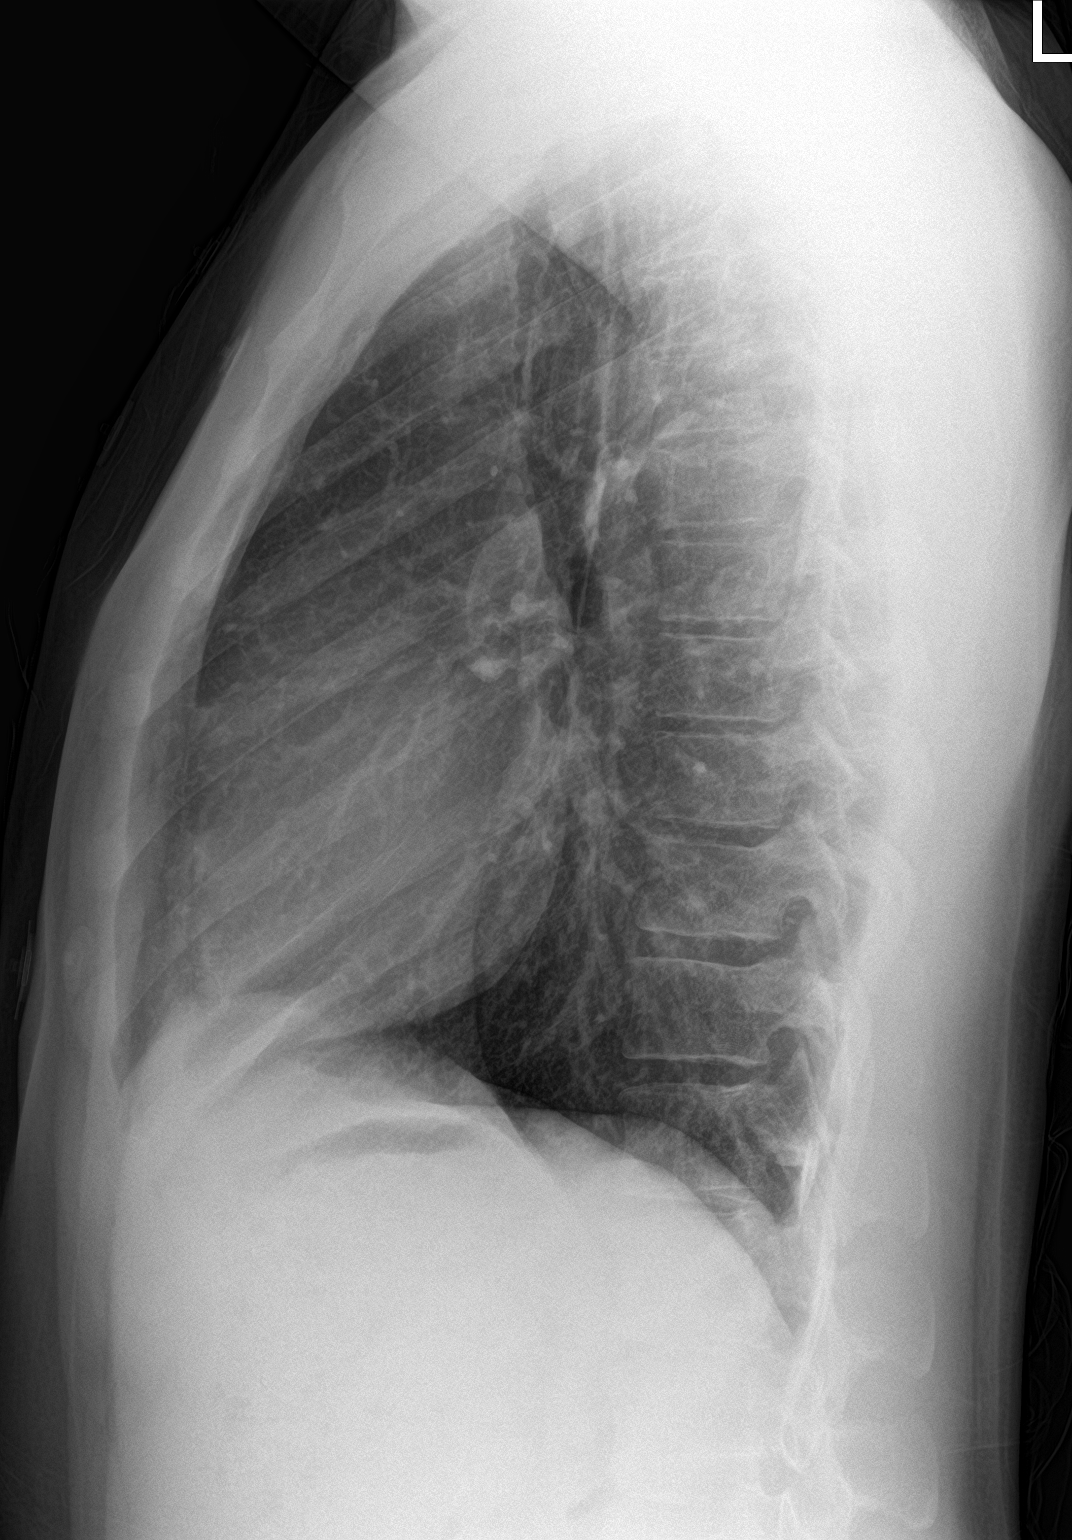

[2 of 2 positions shown; findings below may reference images not displayed]

FINDINGS: The lungs are symmetrically well inflated. A 17 mm nodule is seen
within the right upper lung zone peripherally. The lungs are
otherwise clear. No confluent pulmonary infiltrate. No pneumothorax
or pleural effusion. Cardiac size within normal limits. Pulmonary
vascularity is normal. No acute bone abnormality.
IMPRESSION: No radiographic evidence of acute cardiopulmonary disease.

Right upper lobe pulmonary nodule. This appears new from prior
examination and would be better assessed with dedicated nonemergent
CT examination. Alternatively, a follow-up chest radiograph may be
obtained in 3 months to document stability.

## 2022-09-23 ENCOUNTER — Ambulatory Visit: Payer: 59 | Admitting: Cardiovascular Disease

## 2022-10-14 ENCOUNTER — Ambulatory Visit: Payer: Commercial Managed Care - HMO | Attending: Cardiovascular Disease | Admitting: Cardiovascular Disease

## 2022-10-20 ENCOUNTER — Encounter: Payer: Self-pay | Admitting: *Deleted

## 2022-11-19 ENCOUNTER — Ambulatory Visit: Payer: Commercial Managed Care - HMO | Attending: Cardiovascular Disease | Admitting: Cardiology

## 2022-11-19 ENCOUNTER — Encounter: Payer: Self-pay | Admitting: Cardiology

## 2022-11-19 VITALS — BP 138/72 | HR 109 | Ht 71.0 in | Wt 175.0 lb

## 2022-11-19 DIAGNOSIS — R000 Tachycardia, unspecified: Secondary | ICD-10-CM

## 2022-11-19 DIAGNOSIS — F17200 Nicotine dependence, unspecified, uncomplicated: Secondary | ICD-10-CM

## 2022-11-19 DIAGNOSIS — R071 Chest pain on breathing: Secondary | ICD-10-CM | POA: Diagnosis not present

## 2022-11-19 MED ORDER — PREDNISONE 10 MG (21) PO TBPK: ORAL_TABLET | ORAL | 0 refills | Status: AC

## 2022-11-19 NOTE — Patient Instructions (Addendum)
Medication Instructions:    Prednisone pak   - you will receive 2 pak  alternate taking each day until complete with both packaging.  Eat and drink with each dose.   *If you need a refill on your cardiac medications before your next appointment, please call your pharmacy*   Lab Work:  Not needed     Testing/Procedures: Will be schedule Your physician has requested that you have an exercise tolerance test. For further information please visit [email or web address] Please also follow instruction sheet, as given. This will take place at [facility] [address] Do not drink or eat foods with caffeine for 24 hours before the test. (Chocolate, coffee, tea, or energy drinks) If you use an inhaler, bring it with you to the test. Do not smoke for 4 hours before the test. Wear comfortable shoes and clothing.   Follow-Up: At [facility], you and your health needs are our priority.  As part of our continuing mission to provide you with exceptional heart care, we have created designated Provider Care Teams.  These Care Teams include your primary Cardiologist (physician) and Advanced Practice Providers (APPs -  Physician Assistants and Nurse Practitioners) who all work together to provide you with the care you need, when you need it.  We recommend signing up for the patient portal called "MyChart".  Sign up information is provided on this After Visit Summary.  MyChart is used to connect with patients for Virtual Visits (Telemedicine).  Patients are able to view lab/test results, encounter notes, upcoming appointments, etc.  Non-urgent messages can be sent to your provider as well.   To learn more about what you can do with MyChart, go to [email or web address].    Your next appointment:   2 month(s)  The format for your next appointment:   In Person or virtual   Provider:   Bryan Lemma, MD

## 2022-11-19 NOTE — Progress Notes (Signed)
Primary Care Provider: Patient, No Pcp Per [facility] Cardiologist: Ryan Hew, MD Electrophysiologist: None  Clinic Note: Chief Complaint  Patient presents with   Hospitalization Follow-up    ER follow-up   Chest Pain    Several recurrent episodes of chest pain leading to ER visits.   ===================================  ASSESSMENT/PLAN   Problem List Items Addressed This Visit     Pain of anterior chest wall with respiration    Anterior chest wall pain across the entire left side of the precordium.  Very atypical in nature, more likely chest wall pain.  He has no family history of CAD but does have some diabetes in the family.  Plan: We will treat for possible musculoskeletal costochondritis pain with Dosepak of prednisone. We will also evaluate with GXT to exclude ischemia.      Relevant Orders   EKG 12-Lead   EXERCISE TOLERANCE TEST (ETT)   Cardiac Stress Test: Informed Consent Details: Physician/Practitioner Attestation; Transcribe to consent form and obtain patient signature   Smoker - Primary   Relevant Orders   EKG 12-Lead   EXERCISE TOLERANCE TEST (ETT)   Cardiac Stress Test: Informed Consent Details: Physician/Practitioner Attestation; Transcribe to consent form and obtain patient signature   Sinus tachycardia (Chronic)    Heart rate was pretty fast on initial evaluation, although it seems to have stabilized on my exam.  Stressed importance of adequate hydration.      Relevant Orders   EKG 12-Lead   EXERCISE TOLERANCE TEST (ETT)   Cardiac Stress Test: Informed Consent Details: Physician/Practitioner Attestation; Transcribe to consent form and obtain patient signature    ===================================  HPI:    Ryan [name] is a 36 y.o. 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  He is being seen today  for the Post ER Follow-Up Evaluation of chest pain at the request of Lianne Cure, DO.   Recent Hospitalizations: Multiple ER evaluations for atypical chest pain over the last several years. He was seen twice by cardiology in 2018:-Studies reviewed below. February 2018-Ryan [name], [place]: This was in response to 2 ER visits in November 2017 and berry 2018 with left-sided chest pain and dizziness.  Intermittent aching pain in the left upper chest and dizziness for his whole left side with "drop out ".  Ordered stress echo. June 2018 with Almyra Deforest, PA multiple flights including left-sided weakness facial numbness as well as chest pain.  Ryan [name] recommended establish a PCP and maybe potentially psychiatric care.  Carotid Dopplers ordered.  Ryan [name] was last seen at [facility], ER on 09/04/2022 with complaints of chest pain on the left side radiated to the left arm.  Chest pain was ongoing for over a month.  Described as a pressure and tightness that sometimes doubles him over.  No associated dyspnea or exertional dyspnea.  Chest pain felt to be atypical, nonanginal in nature.  Low heart score and low risk for PE.  Troponin and D-dimer were negative, chest x-ray and EKG were normal..   Reviewed  CV studies:    The following studies were reviewed today: (if available, images/films reviewed: From Epic Chart or Care Everywhere) Stress Echo 04/13/2017: Negative stress echo, normal LV function with rest and stress.  No evidence of ischemia or infarction.  Normal baseline Echo. Exercised 10:30 min -> Stage 4. 12.5 METS.  Max heart rate was 193-there  was 101% MPHR of 191 bpm.  EKG negative for ischemia. Carotid Dopplers 07/16/2017: Normal carotids.   Interval History:   Ryan [name] presents here today for evaluation of chest pain that seems to be pretty much persistent over the last several months since his ER visit.  He says things have calmed down a bit after the his last error visit since he cut  down his cigarettes.  But unfortunately about a week or so the symptoms came right back again.  He describes it as a aching sensation in the left side of his chest into his left shoulder but also goes down into his abdomen.  It can happen with or without activity.  It is not exacerbated by exertion.  It is exacerbated by certain movements.  Not necessarily worse with lying down flat versus sitting up effective and may be better lying down.  Worse with raising his arms-and twisting shoulders.  He seems to be quite anxious and nervous with tachycardia on the EKG that was notably improved upon my evaluation.  He denies any real sensation of rapid irregular heartbeats or palpitations.  He gets lightheaded on occasion but no syncope or near syncope.  No focal tenderness of the chest that he can show me.  No exertional dyspnea, no exertional chest pain.  No PND, orthopnea or edema.  He cannot recall any recent URI or other symptoms.  REVIEWED OF SYSTEMS   Review of Systems  Constitutional:  Negative for malaise/fatigue and weight loss.  HENT:  Negative for congestion and nosebleeds.   Respiratory:  Positive for cough (Occasional). Negative for sputum production and shortness of breath.   Cardiovascular:        Per HPI  Gastrointestinal:  Negative for blood in stool and melena.  Genitourinary:  Negative for hematuria.  Musculoskeletal:  Positive for joint pain (Left shoulder). Negative for falls and myalgias.  Neurological:  Positive for dizziness. Negative for tingling, focal weakness and headaches.  Psychiatric/Behavioral:  Negative for depression and memory loss. The patient is nervous/anxious. The patient does not have insomnia.     I have reviewed and (if needed) personally updated the patient's problem list, medications, allergies, past medical and surgical history, social and family history.   PAST MEDICAL HISTORY   Past Medical History:  Diagnosis Date   Anxiety    Asthma     Depression     PAST SURGICAL HISTORY   History reviewed. No pertinent surgical history.   There is no immunization history on file for this patient.  MEDICATIONS/ALLERGIES   Current Meds  Medication Sig   predniSONE (STERAPRED UNI-PAK 21 TAB) 10 MG (21) TBPK tablet Alternate taking out of each pak  ( take day 1 dosing from pack the next day take Day 1 from the other pack ) until complete both paks.    Allergies  Allergen Reactions   Strawberry Extract Nausea And Vomiting   Pollen Extract Other (See Comments)    SOCIAL HISTORY/FAMILY HISTORY   Reviewed in Epic:   Social History   Tobacco Use   Smoking status: Some Days    Packs/day: 1.00    Years: 5.00    Total pack years: 5.00    Types: Cigarettes    Start date: 01/27/2017   Smokeless tobacco: Never  Substance Use Topics   Alcohol use: No    Alcohol/week: 2.0 standard drinks of alcohol    Types: 2 Cans of beer per week    Comment: quit a few months  ago.   Drug use: Yes    Types: Marijuana   Social History   Social History Narrative   Not on file   Family History  Family history unknown: Yes  He thinks his father side had diabetes but not sure.  Does not know his mother side of the family.  OBJCTIVE -PE, EKG, labs   Wt Readings from Last 3 Encounters:  11/19/22 175 lb (79.4 kg)  09/04/22 150 lb (68 kg)  05/08/22 150 lb (68 kg)    Physical Exam: BP 138/72 (BP Location: Left Arm, Patient Position: Sitting, Cuff Size: Normal)   Pulse (!) 109   Ht 5\' 11"  (1.803 m)   Wt 175 lb (79.4 kg)   SpO2 99%   BMI 24.41 kg/m  Physical Exam Vitals reviewed.  Constitutional:      General: He is not in acute distress.    Appearance: He is normal weight. He is not ill-appearing or toxic-appearing.     Comments: Somewhat disheveled with somewhat unkempt dreadlocks.  Otherwise relatively healthy appearing.  HENT:     Head: Normocephalic and atraumatic.  Neck:     Vascular: No carotid bruit or JVD.   Cardiovascular:     Rate and Rhythm: Regular rhythm. Tachycardia present. No extrasystoles are present.    Chest Wall: PMI is not displaced.     Pulses: Normal pulses and intact distal pulses. No decreased pulses.     Heart sounds: S1 normal and S2 normal. No murmur heard.    No friction rub. No gallop.  Pulmonary:     Effort: Pulmonary effort is normal. No respiratory distress.     Breath sounds: No wheezing, rhonchi or rales.  Chest:     Chest wall: No tenderness (Diffuse tenderness along the left parasternal region radiating to the left shoulder.  Also noted in the left lower costal region.).  Abdominal:     General: Abdomen is flat. Bowel sounds are normal. There is no distension.     Palpations: Abdomen is soft.     Tenderness: There is abdominal tenderness (With exception of the left epigastric area along the tonsil-sternal border.). There is no guarding.  Musculoskeletal:     Cervical back: Normal range of motion and neck supple.  Skin:    General: Skin is warm and dry.  Neurological:     General: No focal deficit present.     Mental Status: He is alert and oriented to person, place, and time.  Psychiatric:        Mood and Affect: Mood normal.        Behavior: Behavior normal.        Thought Content: Thought content normal.     Comments: Very anxious/nervous/potentially paranoid     Adult ECG Report  Rate: 109;  Rhythm: sinus tachycardia and right atrial enlargement, otherwise normal axis, intervals and durations. ;   Narrative Interpretation: Normal  Recent Labs: Available labs reviewed. No results found for: "CHOL", "HDL", "LDLCALC", "LDLDIRECT", "TRIG", "CHOLHDL" Lab Results  Component Value Date   CREATININE 1.07 09/04/2022   BUN 15 09/04/2022   NA 137 09/04/2022   K 4.0 09/04/2022   CL 101 09/04/2022   CO2 26 09/04/2022      Latest Ref Rng & Units 09/04/2022    6:46 PM 05/09/2022   12:05 AM 02/10/2019    4:12 PM  CBC  WBC 4.0 - 10.5 K/uL 11.4  9.3  15.3    Hemoglobin 13.0 - 17.0 g/dL 15.7  15.7  12.0   Hematocrit 39.0 - 52.0 % 45.1  43.9  36.9   Platelets 150 - 400 K/uL 275  257  460     No results found for: "HGBA1C" No results found for: "TSH"  ================================================== I spent a total of 25 minutes with the patient spent in direct patient consultation.  Additional time spent with chart review  / charting (studies, outside notes, etc): 24 min Total Time: 49 min  Current medicines are reviewed at length with the patient today.  (+/- concerns) none  Notice: This dictation was prepared with Dragon dictation along with smart phrase technology. Any transcriptional errors that result from this process are unintentional and may not be corrected upon review.   Studies Ordered:  Orders Placed This Encounter  Procedures   Cardiac Stress Test: Informed Consent Details: Physician/Practitioner Attestation; Transcribe to consent form and obtain patient signature   EXERCISE TOLERANCE TEST (ETT)   EKG 12-Lead   Meds ordered this encounter  Medications   predniSONE (STERAPRED UNI-PAK 21 TAB) 10 MG (21) TBPK tablet    Sig: Alternate taking out of each pak  ( take day 1 dosing from pack the next day take Day 1 from the other pack ) until complete both paks.    Dispense:  42 tablet    Refill:  0   Shared Decision Making/Informed Consent{The risks [chest pain, shortness of breath, cardiac arrhythmias, dizziness, blood pressure fluctuations, myocardial infarction, stroke/transient ischemic attack, and life-threatening complications (estimated to be 1 in 10,000)], benefits (risk stratification, diagnosing coronary artery disease, treatment guidance) and alternatives of an exercise tolerance test were discussed in detail with Ryan [name] and he agrees to proceed.  Patient Instructions / Medication Changes & Studies & Tests Ordered   Patient Instructions  Medication Instructions:    Prednisone pak   - you will receive 2 pak   alternate taking each day until complete with both packaging.  Eat and drink with each dose.   *If you need a refill on your cardiac medications before your next appointment, please call your pharmacy*   Lab Work:  Not needed     Testing/Procedures: Will be schedule Your physician has requested that you have an exercise tolerance test. For further information please visit [email or web address] Please also follow instruction sheet, as given. This will take place at [facility] [address] Do not drink or eat foods with caffeine for 24 hours before the test. (Chocolate, coffee, tea, or energy drinks) If you use an inhaler, bring it with you to the test. Do not smoke for 4 hours before the test. Wear comfortable shoes and clothing.   Follow-Up: At [facility], you and your health needs are our priority.  As part of our continuing mission to provide you with exceptional heart care, we have created designated Provider Care Teams.  These Care Teams include your primary Cardiologist (physician) and Advanced Practice Providers (APPs -  Physician Assistants and Nurse Practitioners) who all work together to provide you with the care you need, when you need it.  We recommend signing up for the patient portal called "MyChart".  Sign up information is provided on this After Visit Summary.  MyChart is used to connect with patients for Virtual Visits (Telemedicine).  Patients are able to view lab/test results, encounter notes, upcoming appointments, etc.  Non-urgent messages can be sent to your provider as well.   To learn more about what you can do with MyChart, go to [email or web address].  Your next appointment:   2 month(s)  The format for your next appointment:   In Person or virtual   Provider:   Glenetta Hew, MD       Ryan Man, MD, MS Ryan [name], M.D., M.S. Interventional Cardiologist  [place]  Pager # [phone] Phone #  [phone] [address] [place], [place] [address]   Thank you for choosing [facility]!!

## 2022-11-23 ENCOUNTER — Encounter: Payer: Self-pay | Admitting: Cardiology

## 2022-11-23 NOTE — Assessment & Plan Note (Signed)
Anterior chest wall pain across the entire left side of the precordium.  Very atypical in nature, more likely chest wall pain.  He has no family history of CAD but does have some diabetes in the family.  Plan: We will treat for possible musculoskeletal costochondritis pain with Dosepak of prednisone. We will also evaluate with GXT to exclude ischemia.

## 2022-11-23 NOTE — Assessment & Plan Note (Signed)
Heart rate was pretty fast on initial evaluation, although it seems to have stabilized on my exam.  Stressed importance of adequate hydration.

## 2022-11-27 ENCOUNTER — Institutional Professional Consult (permissible substitution): Payer: Commercial Managed Care - HMO | Admitting: Pulmonary Disease

## 2023-01-07 ENCOUNTER — Institutional Professional Consult (permissible substitution): Payer: Commercial Managed Care - HMO | Admitting: Pulmonary Disease

## 2023-01-09 ENCOUNTER — Ambulatory Visit: Payer: Medicaid Other | Admitting: Cardiology

## 2023-01-25 NOTE — Progress Notes (Deleted)
Cardiology Office Note:    Date:  01/25/2023   ID:  Ryan [name], DOB 11-30-1986, MRN [id number]  PCP:  Patient, No Pcp Per  Cardiologist:  Glenetta Hew, MD  Electrophysiologist:  None   Referring MD: No ref. provider found   Chief Complaint: follow-up of chest pain   History of Present Illness:    Ryan [name] is a 37 y.o. male with a history of atypical chest pain, asthma, anxiety/ depression, tobacco use, and marijuana use who is followed by Dr. Ellyn Hack and presents today for follow-up of chest pain.  Patient as a long history of atypical chest pain. He was first seen by Cardiology in 01/2017 after multiple ED visits for chest pain and dizziness. Stress Echo was ordered for further evaluation and showed and was normal with no evidence of ischemia. Carotid ultrasound was ordered in 06/2017 for further evaluation of persistent dizziness and showed normal carotid arteries. Symptoms were not felt to be cardiac in nature and he was advised to follow-up with PCP. Psychiatric evaluation was also recommended due to significant anxiety. Patient was not seen by Cardiology again until he was re-referred to Dr. Ellyn Hack in 10/2022 after recent ED visit for chest pain. Patient was seen in the [place] ED in 08/2022 for left sided chest pain that radiated to left arm that had been going on for over a month. He described this pain as a pressure and tightness that sometimes "doubled him over." At follow-up visit with Dr. Ellyn Hack in 10/2022, he reported persistent chest pain since ED visit. It was not exacerbated b exertion but was exacerbated by certain movements (worse with rasing his arms and twisting his shoulders). He seemed very anxious and nervous during this visit. Chest pain was felt to be very atypical in nature. He was started on Prednisone for possible musculoskeletal costochondritis pain. However, ETT was also ordered to exclude ischemia and showed ***  Patient presents today for  follow-up. ***  Atypical Chest Pain Patient has a history of atypical chest pain. Echo stress test in 03/2017 was negative for ischemia. At last visit in 10/2022, patient reported persistent chest pain that was very atypical. He was treated with Prednisone taper for possible musculoskeletal costochondritis. However, ETT was also ordered to exclude ischemia ***  Past Medical History:  Diagnosis Date   Anxiety    Asthma    Depression     No past surgical history on file.  Current Medications: No outpatient medications have been marked as taking for the 02/05/23 encounter (Appointment) with Darreld Mclean, PA-C.     Allergies:   Strawberry extract and Pollen extract   Social History   Socioeconomic History   Marital status: Single    Spouse name: Not on file   Number of children: Not on file   Years of education: Not on file   Highest education level: Not on file  Occupational History   Not on file  Tobacco Use   Smoking status: Some Days    Packs/day: 1.00    Years: 5.00    Total pack years: 5.00    Types: Cigarettes    Start date: 01/27/2017   Smokeless tobacco: Never  Substance and Sexual Activity   Alcohol use: No    Alcohol/week: 2.0 standard drinks of alcohol    Types: 2 Cans of beer per week    Comment: quit a few months ago.   Drug use: Yes    Types: Marijuana   Sexual activity:  Not on file  Other Topics Concern   Not on file  Social History Narrative   Not on file   Social Determinants of Health   Financial Resource Strain: Not on file  Food Insecurity: Not on file  Transportation Needs: Not on file  Physical Activity: Not on file  Stress: Not on file  Social Connections: Not on file     Family History: The patient's Family history is unknown by patient.  ROS:   Please see the history of present illness.     EKGs/Labs/Other Studies Reviewed:    The following studies were reviewed:  Echo Stress Test 04/13/2017: Impressions: - Negative  stress echo.  - No evidence of ischemia  - Normal LV function at rest and with stress  _______________  Carotid Ultrasound 07/16/2017: Impressions: - Normal carotid arteries bilaterally. - Normal subclavian arteries bilaterally. - Patent vertebral arteries with antegrade flow.    EKG:  EKG not ordered today.   Recent Labs: 09/04/2022: BUN 15; Creatinine, Ser 1.07; Hemoglobin 15.7; Platelets 275; Potassium 4.0; Sodium 137  Recent Lipid Panel No results found for: "CHOL", "TRIG", "HDL", "CHOLHDL", "VLDL", "LDLCALC", "LDLDIRECT"  Physical Exam:    Vital Signs: There were no vitals taken for this visit.    Wt Readings from Last 3 Encounters:  11/19/22 175 lb (79.4 kg)  09/04/22 150 lb (68 kg)  05/08/22 150 lb (68 kg)     General: 37 y.o. male in no acute distress. HEENT: Normocephalic and atraumatic. Sclera clear. EOMs intact. Neck: Supple. No carotid bruits. No JVD. Heart: *** RRR. Distinct S1 and S2. No murmurs, gallops, or rubs. Radial and distal pedal pulses 2+ and equal bilaterally. Lungs: No increased work of breathing. Clear to ausculation bilaterally. No wheezes, rhonchi, or rales.  Abdomen: Soft, non-distended, and non-tender to palpation. Bowel sounds present in all 4 quadrants.  MSK: Normal strength and tone for age. *** Extremities: No lower extremity edema.    Skin: Warm and dry. Neuro: Alert and oriented x3. No focal deficits. Psych: Normal affect. Responds appropriately.   Assessment:    No diagnosis found.  Plan:     Disposition: Follow up in ***   Medication Adjustments/Labs and Tests Ordered: Current medicines are reviewed at length with the patient today.  Concerns regarding medicines are outlined above.  No orders of the defined types were placed in this encounter.  No orders of the defined types were placed in this encounter.   There are no Patient Instructions on file for this visit.   Signed, Darreld Mclean, PA-C  01/25/2023 8:19 PM     [facility]

## 2023-01-29 ENCOUNTER — Ambulatory Visit: Payer: Medicaid Other | Admitting: Pulmonary Disease

## 2023-01-29 ENCOUNTER — Ambulatory Visit (INDEPENDENT_AMBULATORY_CARE_PROVIDER_SITE_OTHER): Payer: Medicaid Other

## 2023-01-29 ENCOUNTER — Encounter: Payer: Self-pay | Admitting: Pulmonary Disease

## 2023-01-29 VITALS — BP 112/80 | HR 84 | Ht 70.0 in | Wt 178.0 lb

## 2023-01-29 DIAGNOSIS — R0602 Shortness of breath: Secondary | ICD-10-CM | POA: Diagnosis not present

## 2023-01-29 MED ORDER — FLUTICASONE-SALMETEROL 230-21 MCG/ACT IN AERO: 2.0000 | INHALATION_SPRAY | Freq: Two times a day (BID) | RESPIRATORY_TRACT | 6 refills | Status: AC

## 2023-01-29 MED ORDER — PREDNISONE 20 MG PO TABS: 20.0000 mg | ORAL_TABLET | Freq: Every day | ORAL | 0 refills | Status: AC

## 2023-01-29 MED ORDER — AZITHROMYCIN 250 MG PO TABS: ORAL_TABLET | ORAL | 0 refills | Status: AC

## 2023-01-29 NOTE — Patient Instructions (Signed)
Chest x-ray today  Prescription for Advair Prescription for prednisone Continue albuterol to be used as needed  Pulmonary function test on the day of next visit  Follow-up in 4 to 6 weeks

## 2023-01-29 NOTE — Progress Notes (Deleted)
Ryan [name]    [id number]    March 12, 1986  Primary Care Physician:Patient, No Pcp Per  Referring Physician: No referring provider defined for this encounter.  Chief complaint:  ***  HPI:  ***  Pets: Occupation: Exposures: Smoking history: Travel history: Relevant family history:  Outpatient Encounter Medications as of 01/29/2023  Medication Sig   albuterol (VENTOLIN HFA) 108 (90 Base) MCG/ACT inhaler Inhale into the lungs every 6 (six) hours as needed for wheezing or shortness of breath. PRN   predniSONE (STERAPRED UNI-PAK 21 TAB) 10 MG (21) TBPK tablet Alternate taking out of each pak  ( take day 1 dosing from pack the next day take Day 1 from the other pack ) until complete both paks. (Patient not taking: Reported on 01/29/2023)   No facility-administered encounter medications on file as of 01/29/2023.    Allergies as of 01/29/2023 - Review Complete 01/29/2023  Allergen Reaction Noted   Strawberry extract Nausea And Vomiting 03/31/2014   Pollen extract Other (See Comments) 03/31/2014    Past Medical History:  Diagnosis Date   Anxiety    Asthma    Depression     No past surgical history on file.  Family History  Family history unknown: Yes    Social History   Socioeconomic History   Marital status: Single    Spouse name: Not on file   Number of children: Not on file   Years of education: Not on file   Highest education level: Not on file  Occupational History   Not on file  Tobacco Use   Smoking status: Former    Packs/day: 1.00    Years: 5.00    Total pack years: 5.00    Types: Cigarettes    Start date: 01/27/2017   Smokeless tobacco: Never  Substance and Sexual Activity   Alcohol use: No    Alcohol/week: 2.0 standard drinks of alcohol    Types: 2 Cans of beer per week    Comment: quit a few months ago.   Drug use: Yes    Types: Marijuana   Sexual activity: Not on file  Other Topics Concern   Not on file  Social History Narrative    Not on file   Social Determinants of Health   Financial Resource Strain: Not on file  Food Insecurity: Not on file  Transportation Needs: Not on file  Physical Activity: Not on file  Stress: Not on file  Social Connections: Not on file  Intimate Partner Violence: Not on file    Review of Systems  Vitals:   01/29/23 1511  BP: 112/80  Pulse: 84  SpO2: 95%     Physical Exam   Data Reviewed: ***  Assessment:  ***  Plan/Recommendations: ***   Sherrilyn Rist MD [facility] 01/29/2023, 3:26 PM  CC: No ref. provider found

## 2023-01-29 NOTE — Progress Notes (Signed)
RIOT [name]    [id number]    03/01/1986  Primary Care Physician:Ryan [name], No Pcp Per  Referring Physician: No referring provider defined for this encounter.  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 PRN   predniSONE (STERAPRED UNI-PAK 21 TAB) 10 MG (21) TBPK tablet Alternate taking out of each pak  ( take day 1 dosing from pack the next day take Day 1 from the other pack ) until complete both paks. (Ryan [name] not taking: Reported on 01/29/2023)   No facility-administered encounter medications on file as of 01/29/2023.    Allergies as of 01/29/2023 - Review Complete 01/29/2023  Allergen Reaction Noted   Strawberry extract Nausea And Vomiting 03/31/2014   Pollen extract Other (See Comments) 03/31/2014    Past Medical History:  Diagnosis Date   Anxiety    Asthma    Depression     No past surgical history on file.  Family History  Family history unknown: Yes    Social History   Socioeconomic History   Marital status: Single    Spouse name: Not on file   Number of children: Not on file   Years of education: Not on file   Highest education level: Not on file  Occupational History   Not on file  Tobacco Use   Smoking status: Former    Packs/day: 1.00     Years: 5.00    Total pack years: 5.00    Types: Cigarettes    Start date: 01/27/2017   Smokeless tobacco: Never  Substance and Sexual Activity   Alcohol use: No    Alcohol/week: 2.0 standard drinks of alcohol    Types: 2 Cans of beer per week    Comment: quit a few months ago.   Drug use: Yes    Types: Marijuana   Sexual activity: Not on file  Other Topics Concern   Not on file  Social History Narrative   Not on file   Social Determinants of Health   Financial Resource Strain: Not on file  Food Insecurity: Not on file  Transportation Needs: Not on file  Physical Activity: Not on file  Stress: Not on file  Social Connections: Not on file  Intimate Partner Violence: Not on file    Review of Systems  Respiratory:  Positive for chest tightness and shortness of breath.     Vitals:   01/29/23 1511  BP: 112/80  Pulse: 84  SpO2: 95%     Physical Exam Constitutional:      Appearance: Normal appearance.  HENT:     Head: Normocephalic.     Mouth/Throat:     Mouth: Mucous membranes are moist.  Eyes:     General: No scleral icterus. Cardiovascular:     Rate and  Rhythm: Normal rate and regular rhythm.     Heart sounds: No murmur heard.    No friction rub.  Pulmonary:     Effort: No respiratory distress.     Breath sounds: No stridor. No wheezing or rhonchi.  Musculoskeletal:     Cervical back: No rigidity or tenderness.  Neurological:     Mental Status: He is alert.  Psychiatric:        Mood and Affect: Mood normal.    Data Reviewed: Chest x-ray from September 2023 reviewed showing no acute infiltrate, a right upper lobe nodule that smaller in size  Cardiology workup noted with a stress test result noted  Assessment:  Possibly asthma exacerbation  Chest tightness  Concern for costochondritis in the past  Plan/Recommendations: Course of antibiotics-azithromycin Course of steroids prednisone 20 daily for 7 days  Prescription for Advair to be used 2  puffs twice times daily  Continue rescue inhaler use as needed  Get a chest x-ray today  Schedule for PFT at next visit  Encouraged to call with significant concerns   Ryan Rist MD [facility] 01/29/2023, 3:22 PM  CC: No ref. provider found

## 2023-02-04 NOTE — Progress Notes (Deleted)
Cardiology Clinic Note   Patient Name: Ryan [name] Date of Encounter: 02/04/2023  Primary Care Provider:  Patient, No Pcp Per Primary Cardiologist:  Glenetta Hew, MD  Patient Profile    Ryan [name] is a 37 y.o. male with a past medical history of chest pain, dizziness, tobacco abuse who presents to the clinic today for follow-up after testing.  Past Medical History    Past Medical History:  Diagnosis Date   Anxiety    Asthma    Depression    No past surgical history on file.  Allergies  Allergies  Allergen Reactions   Strawberry Extract Nausea And Vomiting   Pollen Extract Other (See Comments)    History of Present Illness    Ryan [name] has a past medical history of: Chest pain. Stress echo 04/13/2017: Negative stress echo with no evidence of ischemia.  Normal LV function at rest and with stress. Dizziness. Carotid ultrasound 07/16/2017: Normal carotid arteries bilaterally.  Normal subclavian arteries bilaterally. Asthma. Tobacco abuse.  Mr. Allred was first evaluated by cardiology on 02/17/2019 after 3 hospital visits for chest pain on 11/05/2016, 02/08/2017, and 02/11/2017.  He underwent testing as above.  Patient was last seen by Dr. Ellyn Hack on 11/19/2022 for ER follow-up of recurrent chest pain.  Patient was provided with a prednisone Dosepak for possible costochondritis.  GXT was ordered but appears it was never completed.  Today, patient ***  Recurrent chest pain.  Patient has had recurrent chest pain with multiple ED visits since 2017.  Stress echo April 2018 was negative.  Patient ***  Home Medications    No outpatient medications have been marked as taking for the 02/05/23 encounter (Appointment) with Darreld Mclean, PA-C.    Family History    Family History  Family history unknown: Yes   He indicated that his mother is deceased. He indicated that his father is alive.   Social History    Social History   Socioeconomic History    Marital status: Single    Spouse name: Not on file   Number of children: Not on file   Years of education: Not on file   Highest education level: Not on file  Occupational History   Not on file  Tobacco Use   Smoking status: Former    Packs/day: 1.00    Years: 5.00    Total pack years: 5.00    Types: Cigarettes    Start date: 01/27/2017   Smokeless tobacco: Never  Substance and Sexual Activity   Alcohol use: No    Alcohol/week: 2.0 standard drinks of alcohol    Types: 2 Cans of beer per week    Comment: quit a few months ago.   Drug use: Yes    Types: Marijuana   Sexual activity: Not on file  Other Topics Concern   Not on file  Social History Narrative   Not on file   Social Determinants of Health   Financial Resource Strain: Not on file  Food Insecurity: Not on file  Transportation Needs: Not on file  Physical Activity: Not on file  Stress: Not on file  Social Connections: Not on file  Intimate Partner Violence: Not on file     Review of Systems    General: *** No chills, fever, night sweats or weight changes.  Cardiovascular:  No chest pain, dyspnea on exertion, edema, orthopnea, palpitations, paroxysmal nocturnal dyspnea. Dermatological: No rash, lesions/masses Respiratory: No cough, dyspnea Urologic: No hematuria, dysuria Abdominal:  No nausea, vomiting, diarrhea, bright red blood per rectum, melena, or hematemesis Neurologic:  No visual changes, weakness, changes in mental status. All other systems reviewed and are otherwise negative except as noted above.  Physical Exam    VS:  There were no vitals taken for this visit. , BMI There is no height or weight on file to calculate BMI. GEN: *** Well nourished, well developed, in no acute distress. HEENT: Normal. Neck: Supple, no JVD, carotid bruits, or masses. Cardiac: RRR, no murmurs, rubs, or gallops. No clubbing, cyanosis, edema.  Radials/DP/PT 2+ and equal bilaterally.  Respiratory:  Respirations regular  and unlabored, clear to auscultation bilaterally. GI: Soft, nontender, nondistended. MS: No deformity or atrophy. Skin: Warm and dry, no rash. Neuro: Strength and sensation are intact. Psych: Normal affect.  Accessory Clinical Findings    The following studies were reviewed for this visit: ***  Recent Labs: 09/04/2022: BUN 15; Creatinine, Ser 1.07; Hemoglobin 15.7; Platelets 275; Potassium 4.0; Sodium 137   Recent Lipid Panel No results found for: "CHOL", "TRIG", "HDL", "CHOLHDL", "VLDL", "LDLCALC", "LDLDIRECT"  No BP recorded.  {Refresh Note OR Click here to enter BP  :1}***    ECG personally reviewed by me today: ***  No significant changes from ***  {Does this patient have ATRIAL FIBRILLATION?:[phone]}   Assessment & Plan   ***      Disposition: ***   Justice Britain. Caryl Manas, DNP, NP-C     02/04/2023, 7:54 PM [facility] [address] Office [phone] Fax [phone]

## 2023-02-05 ENCOUNTER — Ambulatory Visit: Payer: Medicaid Other | Attending: Cardiology | Admitting: Student

## 2023-02-26 ENCOUNTER — Ambulatory Visit: Payer: Medicaid Other | Attending: Cardiology

## 2023-02-26 DIAGNOSIS — R071 Chest pain on breathing: Secondary | ICD-10-CM

## 2023-02-26 DIAGNOSIS — F17200 Nicotine dependence, unspecified, uncomplicated: Secondary | ICD-10-CM

## 2023-02-26 DIAGNOSIS — R000 Tachycardia, unspecified: Secondary | ICD-10-CM

## 2023-02-26 LAB — EXERCISE TOLERANCE TEST
Angina Index: 0
Base ST Depression (mm): 0 mm
Duke Treadmill Score: 10
Estimated workload: 11.7
Exercise duration (min): 10 min
Exercise duration (sec): 0 s
MPHR: 185 {beats}/min
Peak HR: 193 {beats}/min
Percent HR: 104 %
RPE: 17
Rest HR: 77 {beats}/min
ST Depression (mm): 0 mm

## 2023-02-27 ENCOUNTER — Ambulatory Visit: Payer: Medicaid Other | Admitting: Pulmonary Disease

## 2023-03-03 ENCOUNTER — Telehealth: Payer: Self-pay

## 2023-03-03 NOTE — Telephone Encounter (Signed)
Mychart msg sent

## 2023-03-18 ENCOUNTER — Ambulatory Visit: Payer: Medicaid Other | Admitting: Pulmonary Disease

## 2023-04-24 ENCOUNTER — Ambulatory Visit: Payer: BLUE CROSS/BLUE SHIELD | Attending: Cardiology | Admitting: Cardiology

## 2023-07-07 ENCOUNTER — Ambulatory Visit: Payer: BLUE CROSS/BLUE SHIELD | Admitting: Pulmonary Disease

## 2023-07-31 ENCOUNTER — Ambulatory Visit: Payer: BLUE CROSS/BLUE SHIELD | Admitting: Primary Care

## 2023-07-31 NOTE — Progress Notes (Deleted)
@Patient  ID: Ryan [name], male    DOB: April 13, 1986, 37 y.o.   MRN: [id number]  No chief complaint on file.   Referring provider: Tomma Lightning, MD  HPI: 37 year old male, smoker.  Past medical history significant for sinus tachycardia, smoker.    07/31/2023 Patient presents today for follow-up.  She was seen by Dr. Wynona Neat in February for shortness of breath.  For chest x-ray and pulmonary function testing.  On Advair 230-21 mcg 2 puffs twice daily and given prednisone taper pack.    Imaging Chest x-ray 01/29/2023>> clear lungs, no active cardiopulmonary disease    Allergies  Allergen Reactions   Strawberry Extract Nausea And Vomiting   Pollen Extract Other (See Comments)     There is no immunization history on file for this patient.  Past Medical History:  Diagnosis Date   Anxiety    Asthma    Depression     Tobacco History: Social History   Tobacco Use  Smoking Status Former   Current packs/day: 1.00   Average packs/day: 1 pack/day for 6.5 years (6.5 ttl pk-yrs)   Types: Cigarettes   Start date: 01/27/2017  Smokeless Tobacco Never   Counseling given: Not Answered   Outpatient Medications Prior to Visit  Medication Sig Dispense Refill   albuterol (VENTOLIN HFA) 108 (90 Base) MCG/ACT inhaler Inhale into the lungs every 6 (six) hours as needed for wheezing or shortness of breath. PRN     azithromycin (ZITHROMAX Z-PAK) 250 MG tablet Take 2 tablets day 1 and then 1 daily for 4 days 6 each 0   fluticasone-salmeterol (ADVAIR HFA) 230-21 MCG/ACT inhaler Inhale 2 puffs into the lungs 2 (two) times daily. 1 each 6   predniSONE (DELTASONE) 20 MG tablet Take 1 tablet (20 mg total) by mouth daily with breakfast. 10 tablet 0   predniSONE (STERAPRED UNI-PAK 21 TAB) 10 MG (21) TBPK tablet Alternate taking out of each pak  ( take day 1 dosing from pack the next day take Day 1 from the other pack ) until complete both paks. (Patient not taking: Reported on 01/29/2023) 42  tablet 0   No facility-administered medications prior to visit.      Review of Systems  Review of Systems   Physical Exam  There were no vitals taken for this visit. Physical Exam   Lab Results:  CBC    Component Value Date/Time   WBC 11.4 (H) 09/04/2022 1846   RBC 4.96 09/04/2022 1846   HGB 15.7 09/04/2022 1846   HCT 45.1 09/04/2022 1846   PLT 275 09/04/2022 1846   MCV 90.9 09/04/2022 1846   MCH 31.7 09/04/2022 1846   MCHC 34.8 09/04/2022 1846   RDW 12.7 09/04/2022 1846   LYMPHSABS 2.7 05/09/2022 0005   MONOABS 0.7 05/09/2022 0005   EOSABS 0.2 05/09/2022 0005   BASOSABS 0.1 05/09/2022 0005    BMET    Component Value Date/Time   NA 137 09/04/2022 1846   K 4.0 09/04/2022 1846   CL 101 09/04/2022 1846   CO2 26 09/04/2022 1846   GLUCOSE 102 (H) 09/04/2022 1846   BUN 15 09/04/2022 1846   CREATININE 1.07 09/04/2022 1846   CALCIUM 9.8 09/04/2022 1846   GFRNONAA >60 09/04/2022 1846   GFRAA >60 02/10/2019 1612    BNP No results found for: "BNP"  ProBNP No results found for: "PROBNP"  Imaging: No results found.   Assessment & Plan:   No problem-specific Assessment & Plan notes found for this encounter.  Ryan Bayley, NP 07/31/2023

## 2023-09-03 ENCOUNTER — Ambulatory Visit: Payer: BLUE CROSS/BLUE SHIELD | Admitting: Primary Care

## 2023-09-14 ENCOUNTER — Encounter: Payer: Self-pay | Admitting: Primary Care

## 2024-04-21 ENCOUNTER — Emergency Department (HOSPITAL_COMMUNITY)

## 2024-04-21 ENCOUNTER — Other Ambulatory Visit: Payer: Self-pay

## 2024-04-21 ENCOUNTER — Encounter (HOSPITAL_COMMUNITY): Payer: Self-pay

## 2024-04-21 ENCOUNTER — Emergency Department (HOSPITAL_COMMUNITY)
Admission: EM | Admit: 2024-04-21 | Discharge: 2024-04-21 | Disposition: A | Discharge status: Home / Self Care | Attending: Emergency Medicine | Admitting: Emergency Medicine

## 2024-04-21 DIAGNOSIS — R10816 Epigastric abdominal tenderness: Secondary | ICD-10-CM | POA: Diagnosis not present

## 2024-04-21 DIAGNOSIS — R102 Pelvic and perineal pain: Secondary | ICD-10-CM | POA: Insufficient documentation

## 2024-04-21 DIAGNOSIS — R079 Chest pain, unspecified: Secondary | ICD-10-CM | POA: Diagnosis not present

## 2024-04-21 DIAGNOSIS — R072 Precordial pain: Secondary | ICD-10-CM | POA: Diagnosis not present

## 2024-04-21 DIAGNOSIS — R200 Anesthesia of skin: Secondary | ICD-10-CM | POA: Diagnosis not present

## 2024-04-21 DIAGNOSIS — Q600 Renal agenesis, unilateral: Secondary | ICD-10-CM | POA: Diagnosis not present

## 2024-04-21 DIAGNOSIS — M549 Dorsalgia, unspecified: Secondary | ICD-10-CM | POA: Diagnosis not present

## 2024-04-21 LAB — CBC
HCT: 43 % (ref 39.0–52.0)
Hemoglobin: 14.6 g/dL (ref 13.0–17.0)
MCH: 31.3 pg (ref 26.0–34.0)
MCHC: 34 g/dL (ref 30.0–36.0)
MCV: 92.3 fL (ref 80.0–100.0)
Platelets: 282 10*3/uL (ref 150–400)
RBC: 4.66 MIL/uL (ref 4.22–5.81)
RDW: 13 % (ref 11.5–15.5)
WBC: 10.2 10*3/uL (ref 4.0–10.5)
nRBC: 0 % (ref 0.0–0.2)

## 2024-04-21 LAB — BASIC METABOLIC PANEL WITH GFR
Anion gap: 11 (ref 5–15)
BUN: 11 mg/dL (ref 6–20)
CO2: 23 mmol/L (ref 22–32)
Calcium: 9.5 mg/dL (ref 8.9–10.3)
Chloride: 104 mmol/L (ref 98–111)
Creatinine, Ser: 1.07 mg/dL (ref 0.61–1.24)
GFR, Estimated: >60 mL/min (ref >60)
Glucose, Bld: 105 mg/dL — ABNORMAL HIGH (ref 70–99)
Potassium: 3.8 mmol/L (ref 3.5–5.1)
Sodium: 138 mmol/L (ref 135–145)

## 2024-04-21 LAB — D-DIMER, QUANTITATIVE: D-Dimer, Quant: <0.27 ug{FEU}/mL (ref 0.00–0.50)

## 2024-04-21 LAB — HEPATIC FUNCTION PANEL
ALT: 24 U/L (ref 0–44)
AST: 28 U/L (ref 15–41)
Albumin: 4.6 g/dL (ref 3.5–5.0)
Alkaline Phosphatase: 61 U/L (ref 38–126)
Bilirubin, Direct: <0.1 mg/dL (ref 0.0–0.2)
Total Bilirubin: 0.7 mg/dL (ref 0.0–1.2)
Total Protein: 7.4 g/dL (ref 6.5–8.1)

## 2024-04-21 LAB — TROPONIN I (HIGH SENSITIVITY)
Troponin I (High Sensitivity): 6 ng/L (ref <18)
Troponin I (High Sensitivity): 5 ng/L (ref <18)

## 2024-04-21 LAB — RAPID URINE DRUG SCREEN, HOSP PERFORMED
Amphetamines: NOT DETECTED
Barbiturates: NOT DETECTED
Benzodiazepines: NOT DETECTED
Cocaine: POSITIVE — AB
Opiates: NOT DETECTED
Tetrahydrocannabinol: POSITIVE — AB

## 2024-04-21 LAB — LIPASE, BLOOD: Lipase: 42 U/L (ref 11–51)

## 2024-04-21 MED ORDER — IOHEXOL 350 MG/ML SOLN
75.0000 mL | Freq: Once | INTRAVENOUS | Status: AC | PRN
  Administered 2024-04-21: 75 mL via INTRAVENOUS

## 2024-04-21 MED ORDER — ALBUTEROL SULFATE HFA 108 (90 BASE) MCG/ACT IN AERS
1.0000 | INHALATION_SPRAY | Freq: Once | RESPIRATORY_TRACT | Status: AC
  Administered 2024-04-21: 1 via RESPIRATORY_TRACT
  Filled 2024-04-21: qty 6.7

## 2024-04-21 NOTE — Discharge Instructions (Addendum)
 You are seen today for chest pain.  We discussed your CT scan which did show possible emphysema.  Recommend sensation of smoking.  I have given you an albuterol  inhaler.  2 puffs as needed for cough and shortness of breath  Make sure to follow-up outpatient, return for any worsening symptoms

## 2024-04-21 NOTE — ED Notes (Signed)
 No answer in lobby at

## 2024-04-21 NOTE — ED Provider Triage Note (Signed)
 Emergency Medicine Provider Triage Evaluation Note  Ryan [name] , a 38 y.o. male  was evaluated in triage.  Pt complains of chest pain shortness of breath..  Review of Systems  Positive: Shortness of breath.  Pain in leg Negative: Fevers  Physical Exam  BP (!) 155/89 (BP Location: Right Arm)   Pulse (!) 138   Temp 99.6 F (37.6 C)   Resp 18   Ht 5\' 10"  (1.778 m)   Wt 80.7 kg   SpO2 100%   BMI 25.54 kg/m  Tachycardia.  Medical Decision Making  Medically screening exam initiated at 9:53 AM.  Appropriate orders placed.  Ryan [name] was informed that the remainder of the evaluation will be completed by another provider, this initial triage assessment does not replace that evaluation, and the importance of remaining in the ED until their evaluation is complete.  Patient with chest pain.  Anterior chest.  Also leg pain.  EKG shows only tachycardia.  Will get blood work including D-dimer.   Ryan Arias, MD 04/21/24 [phone]

## 2024-04-21 NOTE — ED Provider Notes (Signed)
 [place] EMERGENCY DEPARTMENT AT [facility] Provider Note   CSN: [id number] Arrival date & time: 04/21/24  0847     History  Chief Complaint  Patient presents with   Chest Pain    Ryan [name] is a 38 y.o. male here for evaluation of chest pain.  Goes from epigastric region, upper chest into his back.  Intermittent nature.  Has been intermittent over the last few use, but was previously followed by cardiology who thought likely musculoskeletal nature.  Symptoms increased over the last week associated palpitations.  Intermittently gets tingling in his right leg however no redness, swelling, warmth.  No recent falls or injuries.  Garlic and honey make his symptoms better.  Admits to marijuana/ cocaine use. No BRBPR or melena.   He does not take any medications at home.  No fever, cough, nausea, vomiting, diarrhea.  No history of PE or DVT.    HPI     Home Medications Prior to Admission medications   Medication Sig Start Date End Date Taking? Authorizing Provider  albuterol  (VENTOLIN  HFA) 108 (90 Base) MCG/ACT inhaler Inhale into the lungs every 6 (six) hours as needed for wheezing or shortness of breath. PRN    [provider]  azithromycin  (ZITHROMAX  Z-PAK) 250 MG tablet Take 2 tablets day 1 and then 1 daily for 4 days 01/29/23   Margaretann Sharper, MD  fluticasone -salmeterol (ADVAIR HFA) 230-21 MCG/ACT inhaler Inhale 2 puffs into the lungs 2 (two) times daily. 01/29/23   Margaretann Sharper, MD  predniSONE  (DELTASONE ) 20 MG tablet Take 1 tablet (20 mg total) by mouth daily with breakfast. 01/29/23   Olalere, Adewale A, MD  predniSONE  (STERAPRED UNI-PAK 21 TAB) 10 MG (21) TBPK tablet Alternate taking out of each pak  ( take day 1 dosing from pack the next day take Day 1 from the other pack ) until complete both paks. Patient not taking: Reported on 01/29/2023 11/19/22   Arleen Lacer, MD      Allergies    Strawberry extract and Pollen extract    Review of  Systems   Review of Systems  Constitutional: Negative.   HENT: Negative.    Respiratory:  Positive for shortness of breath.   Cardiovascular:  Positive for chest pain.  Gastrointestinal:  Positive for abdominal pain.  Genitourinary: Negative.   Musculoskeletal:  Positive for back pain.  Skin: Negative.   Neurological: Negative.   All other systems reviewed and are negative.   Physical Exam Updated Vital Signs BP (!) 138/108 (BP Location: Right Arm)   Pulse 83   Temp 98.1 F (36.7 C)   Resp 17   Ht 5\' 10"  (1.778 m)   Wt 80.7 kg   SpO2 100%   BMI 25.54 kg/m  Physical Exam Vitals and nursing note reviewed.  Constitutional:      General: He is not in acute distress.    Appearance: He is well-developed. He is not ill-appearing, toxic-appearing or diaphoretic.  HENT:     Head: Atraumatic.  Eyes:     Pupils: Pupils are equal, round, and reactive to light.  Cardiovascular:     Rate and Rhythm: Normal rate and regular rhythm.     Pulses:          Radial pulses are 2+ on the right side and 2+ on the left side.       Dorsalis pedis pulses are 2+ on the right side and 2+ on the left side.  Heart sounds: Normal heart sounds.  Pulmonary:     Effort: Pulmonary effort is normal. No respiratory distress.     Breath sounds: Normal breath sounds.  Chest:     Chest wall: No tenderness or crepitus.  Abdominal:     General: Bowel sounds are normal. There is no distension or abdominal bruit.     Palpations: Abdomen is soft. There is no fluid wave, hepatomegaly, splenomegaly or mass.     Tenderness: There is abdominal tenderness (epigastric tenderness, neg murphy).  Musculoskeletal:        General: Normal range of motion.     Cervical back: Normal range of motion and neck supple.     Right lower leg: No tenderness. No edema.     Left lower leg: No tenderness. No edema.     Comments: No bony tenderness, compartments soft, full range of motion  Skin:    General: Skin is warm and  dry.     Capillary Refill: Capillary refill takes less than 2 seconds.  Neurological:     General: No focal deficit present.     Mental Status: He is alert and oriented to person, place, and time.     Cranial Nerves: Cranial nerves 2-12 are intact. No cranial nerve deficit.     Sensory: Sensation is intact.     Motor: Motor function is intact. No weakness.     Gait: Gait is intact.     Comments: Intact sensation Equal strength Ambulatory without difficulty     ED Results / Procedures / Treatments   Labs (all labs ordered are listed, but only abnormal results are displayed) Labs Reviewed  BASIC METABOLIC PANEL WITH GFR - Abnormal; Notable for the following components:      Result Value   Glucose, Bld 105 (*)    All other components within normal limits  RAPID URINE DRUG SCREEN, HOSP PERFORMED - Abnormal; Notable for the following components:   Cocaine POSITIVE (*)    Tetrahydrocannabinol POSITIVE (*)    All other components within normal limits  CBC  D-DIMER, QUANTITATIVE  HEPATIC FUNCTION PANEL  LIPASE, BLOOD  TROPONIN I (HIGH SENSITIVITY)  TROPONIN I (HIGH SENSITIVITY)    EKG EKG Interpretation Date/Time:  Thursday April 21 2024 08:53:20 EDT Ventricular Rate:  125 PR Interval:  134 QRS Duration:  90 QT Interval:  314 QTC Calculation: 453 R Axis:   89  Text Interpretation: Sinus tachycardia Right atrial enlargement Borderline ECG When compared with ECG of 04-Sep-2022 14:29,  rate is increased Confirmed by Mozell Arias [phone]) on 04/21/2024 9:51:10 AM  Radiology CT Angio Chest/Abd/Pel for Dissection W and/or Wo Contrast Result Date: 04/21/2024 CLINICAL DATA:  Aortic aneurysm suspected, chest pain, back pain, numbness, cocaine use EXAM: CT ANGIOGRAPHY CHEST, ABDOMEN AND PELVIS TECHNIQUE: Non-contrast CT of the chest was initially obtained. Multidetector CT imaging through the chest, abdomen and pelvis was performed using the standard protocol during bolus  administration of intravenous contrast. Multiplanar reconstructed images and MIPs were obtained and reviewed to evaluate the vascular anatomy. RADIATION DOSE REDUCTION: This exam was performed according to the departmental dose-optimization program which includes automated exposure control, adjustment of the mA and/or kV according to patient size and/or use of iterative reconstruction technique. CONTRAST:  75mL OMNIPAQUE  IOHEXOL  350 MG/ML SOLN COMPARISON:  None Available. FINDINGS: CTA CHEST FINDINGS VASCULAR Aorta: Satisfactory opacification of the aorta. Normal contour and caliber of the thoracic aorta. No evidence of aneurysm, dissection, or other acute aortic pathology. Cardiovascular: No evidence of  pulmonary embolism on limited non-tailored examination. Normal heart size. No pericardial effusion. Review of the MIP images confirms the above findings. NON VASCULAR Mediastinum/Nodes: No enlarged mediastinal, hilar, or axillary lymph nodes. Thyroid gland, trachea, and esophagus demonstrate no significant findings. Lungs/Pleura: Minimal paraseptal emphysema. Mild, diffuse bilateral bronchial wall thickening. Background fine centrilobular nodularity throughout the lungs. Mild biapical pleural-parenchymal scarring. Scarring of the dependent right lower lobe (series 7, image 75). No pleural effusion or pneumothorax. Musculoskeletal: No chest wall abnormality. No acute osseous findings. Review of the MIP images confirms the above findings. CTA ABDOMEN AND PELVIS FINDINGS VASCULAR Normal contour and caliber of the abdominal aorta. No evidence of aneurysm, dissection, or other acute aortic pathology. Standard branching pattern of the abdominal aorta with solitary bilateral renal arteries. Review of the MIP images confirms the above findings. NON-VASCULAR Hepatobiliary: No solid liver abnormality is seen. No gallstones, gallbladder wall thickening, or biliary dilatation. Pancreas: Unremarkable. No pancreatic ductal  dilatation or surrounding inflammatory changes. Spleen: Normal in size without significant abnormality. Adrenals/Urinary Tract: Adrenal glands are unremarkable. Kidneys are normal, without renal calculi, solid lesion, or hydronephrosis. Bladder is unremarkable. Stomach/Bowel: Stomach is within normal limits. Appendix appears normal. No evidence of bowel wall thickening, distention, or inflammatory changes. Lymphatic: No enlarged abdominal or pelvic lymph nodes. Reproductive: No mass or other significant abnormality. Other: No abdominal wall hernia or abnormality. No ascites. Musculoskeletal: No acute osseous findings. IMPRESSION: 1. Normal contour and caliber of the thoracic and abdominal aorta. No evidence of aneurysm, dissection, or other acute aortic pathology. No significant atherosclerosis. 2. Minimal paraseptal emphysema. Smoking related respiratory bronchiolitis. Emphysema (ICD10-J43.9). Electronically Signed   By: Fredricka Jenny M.D.   On: 04/21/2024 19:59   DG Chest 2 View Result Date: 04/21/2024 CLINICAL DATA:  Chest pain. EXAM: CHEST - 2 VIEW COMPARISON:  January 29, 2023. FINDINGS: The heart size and mediastinal contours are within normal limits. Both lungs are clear. The visualized skeletal structures are unremarkable. IMPRESSION: No active cardiopulmonary disease. Electronically Signed   By: Rosalene Colon M.D.   On: 04/21/2024 10:05    Procedures Procedures    Medications Ordered in ED Medications  iohexol  (OMNIPAQUE ) 350 MG/ML injection 75 mL (75 mLs Intravenous Contrast Given 04/21/24 1922)  albuterol  (VENTOLIN  HFA) 108 (90 Base) MCG/ACT inhaler 1 puff (1 puff Inhalation Given 04/21/24 2122)    ED Course/ Medical Decision Making/ A&P   38 year old here for evaluation of recurrent intermittent chest pain, shortness of breath and back pain.  He states been going on for many years however worse over the last week.  Pain to mid chest wall, epigastric region and into back.  Occasionally  gets some tingling in his right leg however no clinical evidence of VTE.  He denies any symptoms to suggest GI bleed.  He does use marijuana and cocaine.  No EtOH use.  He is neurovascular intact.  Recurrent palpitations.  Will plan on labs and imaging.  Labs and imaging personally viewed and interpreted:  CBC without leukocytosis Metabolic panel without significant abnormality D-dimer within normal limits Troponin x 2 negative UDS positive for THC, cocaine EKG x-ray without significant abnormality EKG without ischemic changes-does show sinus tachycardia  Patient reassessed.  We discussed labs and imaging.  Will plan on CT imaging.  Hep fun panel without significant abnormality Lipase without significant abnormality CTA dissection shows emphysema changes, no acute pathology  Discussed results with patient.  His tachycardia improved.  He was given albuterol  inhaler for emphysematous changes on his  CT scan however no active observation at this time.  Unclear why he has been having some recurrent chest pain.  Did encourage cessation of smoking as well as cocaine use as this can cause chest pain.  Low suspicion for acute ACS, PE, dissection, pneumothorax, VTE, ischemia, CVA, cauda equina, discitis, osteomyelitis as cause of his symptoms.  The patient has been appropriately medically screened and/or stabilized in the ED. I have low suspicion for any other emergent medical condition which would require further screening, evaluation or treatment in the ED or require inpatient management.  Patient is hemodynamically stable and in no acute distress.  Patient able to ambulate in department prior to ED.  Evaluation does not show acute pathology that would require ongoing or additional emergent interventions while in the emergency department or further inpatient treatment.  I have discussed the diagnosis with the patient and answered all questions.  Pain is been managed while in the emergency department  and patient has no further complaints prior to discharge.  Patient is comfortable with plan discussed in room and is stable for discharge at this time.  I have discussed strict return precautions for returning to the emergency department.  Patient was encouraged to follow-up with PCP/specialist refer to at discharge.                                Medical Decision Making Amount and/or Complexity of Data Reviewed External Data Reviewed: labs, radiology, ECG and notes. Labs: ordered. Decision-making details documented in ED Course. Radiology: ordered and independent interpretation performed. Decision-making details documented in ED Course. ECG/medicine tests: ordered and independent interpretation performed. Decision-making details documented in ED Course.  Risk OTC drugs. Prescription drug management. Decision regarding hospitalization. Diagnosis or treatment significantly limited by social determinants of health.           Final Clinical Impression(s) / ED Diagnoses Final diagnoses:  Precordial pain    Rx / DC Orders ED Discharge Orders     None         Canyon Lohr A, PA-C 04/21/24 2142    Kingsley, Victoria K, DO 04/21/24 2236

## 2024-04-21 NOTE — ED Triage Notes (Signed)
 Patient reports chest pain going into his shoulder Reports pinching sensatino in epigastric region.  Also reports numbness tingling and pressure to right leg.

## 2024-05-12 ENCOUNTER — Other Ambulatory Visit: Payer: Self-pay | Admitting: Pulmonary Disease

## 2024-05-12 ENCOUNTER — Other Ambulatory Visit: Payer: Self-pay | Admitting: Cardiology

## 2024-05-12 NOTE — Telephone Encounter (Signed)
 Medication denied.  Patient has not been evaluated or seen in office since 11/19/2022.  Patient would need an appointment with provider or APP.
